# Patient Record
Sex: Female | Born: 2000 | Race: Black or African American | Hispanic: No | Marital: Single | Smoking: Never smoker
Health system: Southern US, Community
[De-identification: ages and names within clinical notes are randomized; demographics above are authoritative.]

## PROBLEM LIST (undated history)

## (undated) DIAGNOSIS — F419 Anxiety disorder, unspecified: Secondary | ICD-10-CM

## (undated) DIAGNOSIS — D649 Anemia, unspecified: Secondary | ICD-10-CM

## (undated) DIAGNOSIS — D571 Sickle-cell disease without crisis: Secondary | ICD-10-CM

## (undated) DIAGNOSIS — J45909 Unspecified asthma, uncomplicated: Secondary | ICD-10-CM

## (undated) DIAGNOSIS — T7840XA Allergy, unspecified, initial encounter: Secondary | ICD-10-CM

## (undated) HISTORY — DX: Allergy, unspecified, initial encounter: T78.40XA

## (undated) HISTORY — DX: Anxiety disorder, unspecified: F41.9

## (undated) HISTORY — DX: Sickle-cell disease without crisis: D57.1

## (undated) HISTORY — DX: Anemia, unspecified: D64.9

---
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
## 2018-03-18 IMAGING — DX DG CHEST 2V
2 series · 2 of 2 positions shown · non-contrast
Comparison: None.

CLINICAL DATA: Chest pain off and on for 4 weeks

EXAM:
CHEST  2 VIEW

[w chest pa]
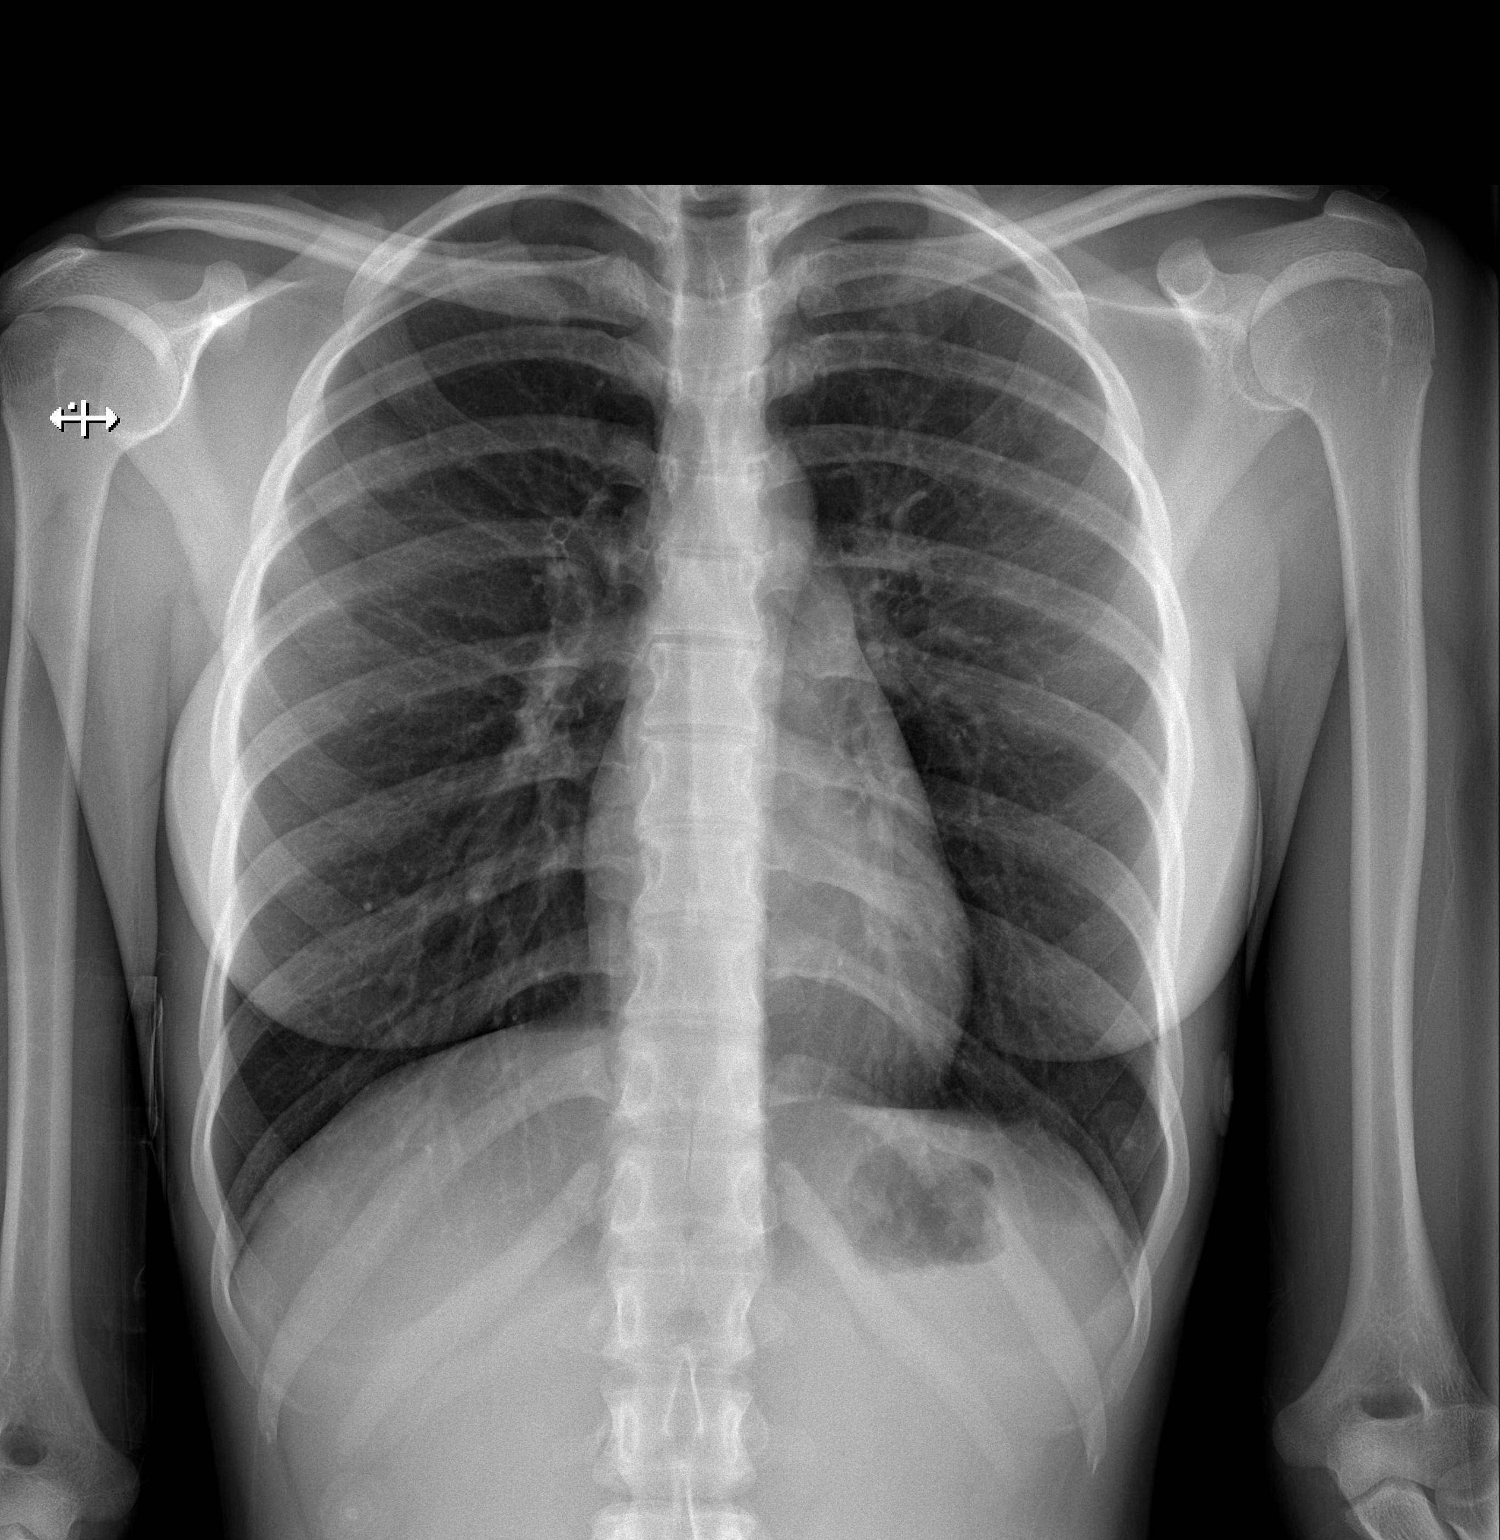

[w chest lat]
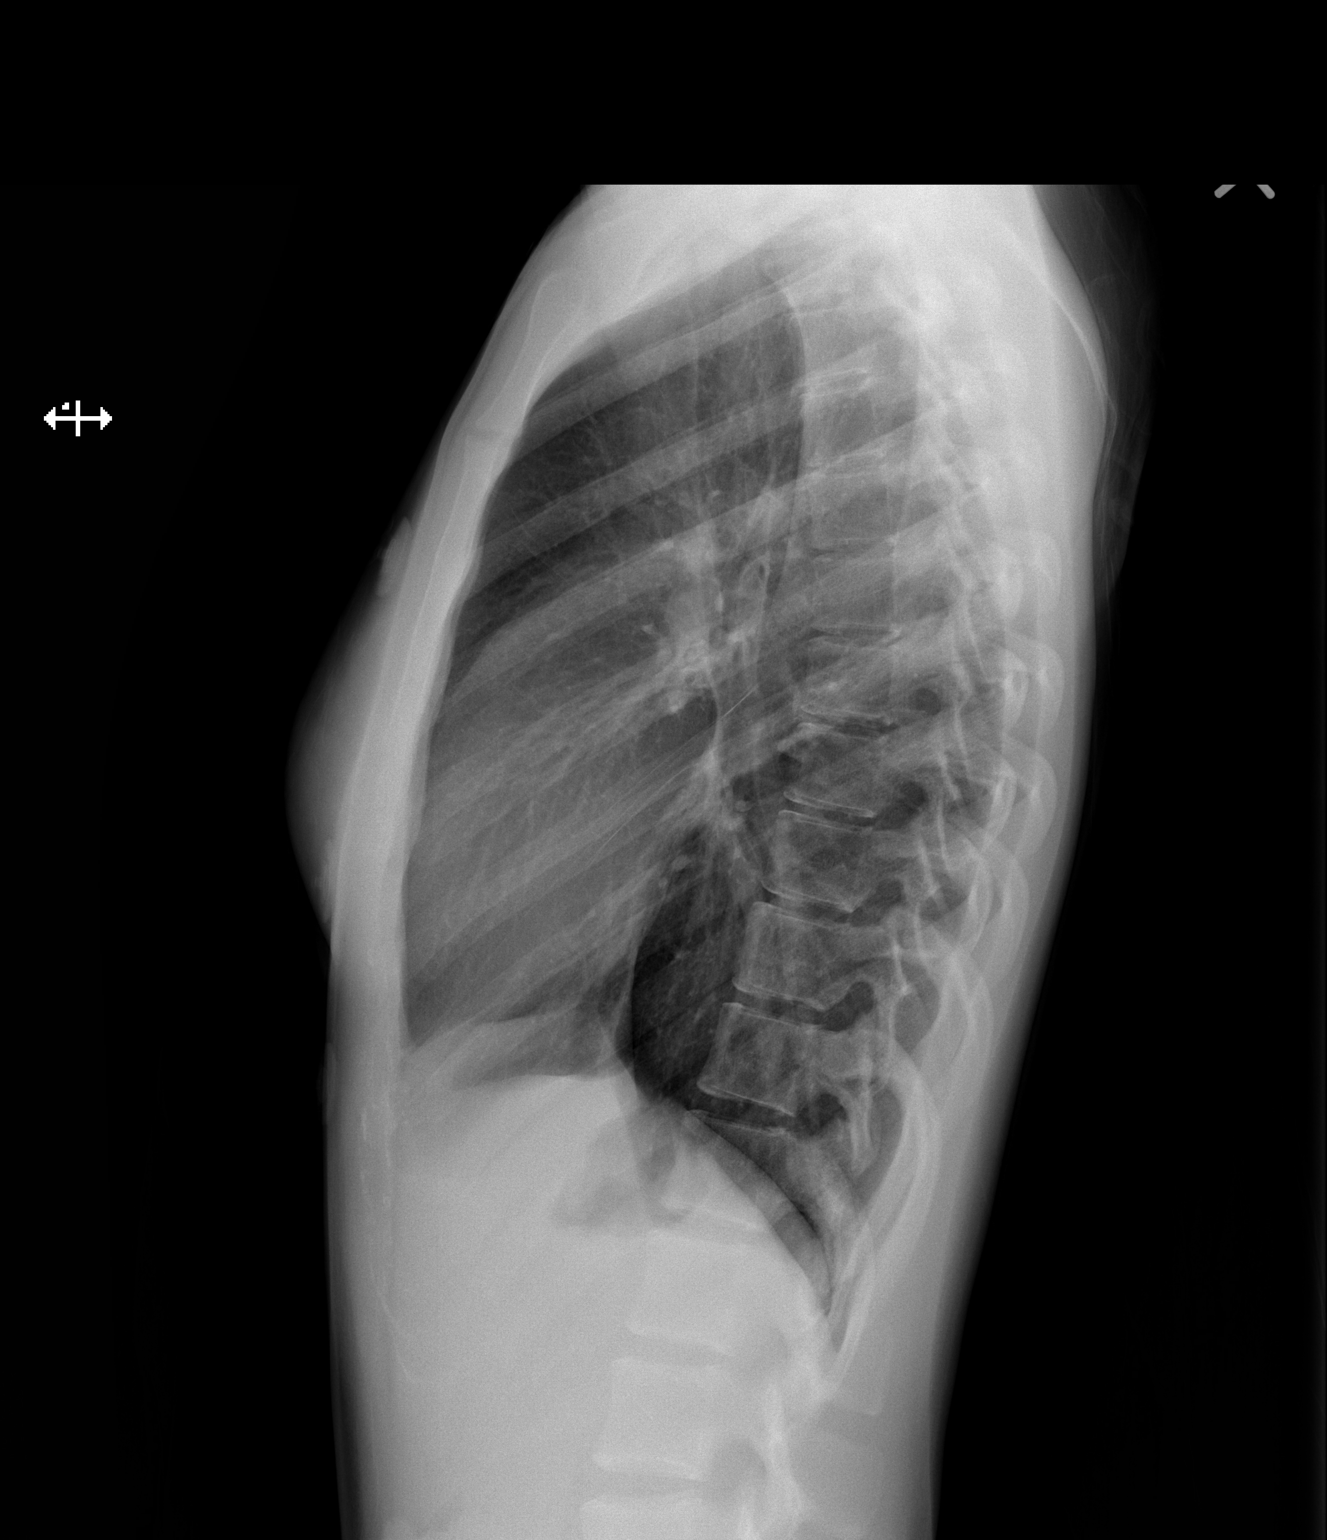

[2 of 2 positions shown; findings below may reference images not displayed]

FINDINGS: The heart size and mediastinal contours are within normal limits.
Both lungs are clear. The visualized skeletal structures are
unremarkable.
IMPRESSION: No active cardiopulmonary disease.

## 2018-06-26 ENCOUNTER — Ambulatory Visit (INDEPENDENT_AMBULATORY_CARE_PROVIDER_SITE_OTHER): Payer: Medicaid Other | Admitting: Pediatrics

## 2018-06-26 ENCOUNTER — Encounter: Payer: Self-pay | Admitting: Pediatrics

## 2018-06-26 VITALS — BP 120/85 | HR 63 | Ht 63.75 in | Wt 98.4 lb

## 2018-06-26 DIAGNOSIS — N920 Excessive and frequent menstruation with regular cycle: Secondary | ICD-10-CM | POA: Diagnosis not present

## 2018-06-26 DIAGNOSIS — Z130 Encounter for screening for diseases of the blood and blood-forming organs and certain disorders involving the immune mechanism: Secondary | ICD-10-CM | POA: Diagnosis not present

## 2018-06-26 DIAGNOSIS — D500 Iron deficiency anemia secondary to blood loss (chronic): Secondary | ICD-10-CM | POA: Diagnosis not present

## 2018-06-26 DIAGNOSIS — Z975 Presence of (intrauterine) contraceptive device: Secondary | ICD-10-CM

## 2018-06-26 DIAGNOSIS — Z3046 Encounter for surveillance of implantable subdermal contraceptive: Secondary | ICD-10-CM

## 2018-06-26 DIAGNOSIS — Z113 Encounter for screening for infections with a predominantly sexual mode of transmission: Secondary | ICD-10-CM

## 2018-06-26 LAB — POCT HEMOGLOBIN: Hemoglobin: 11.9 g/dL — AB (ref 12.2–16.2)

## 2018-06-26 MED ORDER — FERROUS SULFATE 325 (65 FE) MG PO TBEC: 325.0000 mg | DELAYED_RELEASE_TABLET | Freq: Every day | ORAL | 11 refills | Status: DC

## 2018-06-26 NOTE — Progress Notes (Signed)

## 2018-06-26 NOTE — Progress Notes (Signed)
History was provided by the patient.  Andrea GuanKeeshaun Fernandez is a 17 y.o. female who is here for follow up of nexplanon   PCP confirmed? Yes.  - was seeing resident PCP but needs to transition to adult care. Gave phone number and instructions for Family Medicine  [name], Cherece Nicole, MD  HPI:  She has been having a non-stop period for 1 year. It is heavy. She is bleeding every day. She goes through a lot of pads and tampons. She was fine for about a month or two but then has had straight bleeding.   Not currently sexually active. Is with a female parnter and prefers only females. She was attempting to use nexplanon to try and stop bleeding, however, it has obviously not been successful. She is unsure if she would remember a pill every day but does have some interest in the patch. She would like to see what her cycle does without anything.   Was having heavy periods. Used to have depo but that also didn't help bleeding as much as she would have liked.   Review of Systems  Constitutional: Negative for malaise/fatigue.  Eyes: Negative for double vision.  Respiratory: Negative for shortness of breath.   Cardiovascular: Negative for chest pain and palpitations.  Gastrointestinal: Negative for abdominal pain, constipation, diarrhea, nausea and vomiting.  Genitourinary: Negative for dysuria.  Musculoskeletal: Negative for joint pain and myalgias.  Skin: Negative for rash.  Neurological: Positive for headaches. Negative for dizziness.  Endo/Heme/Allergies: Does not bruise/bleed easily.     Patient Active Problem List   Diagnosis Date Noted  . Nexplanon in place 02/28/2017    Current Outpatient Medications on File Prior to Visit  Medication Sig Dispense Refill  . ibuprofen (ADVIL,MOTRIN) 200 MG tablet Take 800 mg by mouth every 6 (six) hours as needed for mild pain.     No current facility-administered medications on file prior to visit.     No Known Allergies  Physical Exam:    Vitals:    06/26/18 1714  BP: 120/85  Pulse: 63  Weight: 98 lb 6.4 oz (44.6 kg)  Height: 5' 3.75" (1.619 m)    Blood pressure percentiles are not available for patients who are 18 years or older. No LMP recorded.  Physical Exam  Constitutional: She is oriented to person, place, and time. She appears well-developed and well-nourished.  Thin  HENT:  Head: Normocephalic.  Neck: No thyromegaly present.  Cardiovascular: Normal rate, regular rhythm, normal heart sounds and intact distal pulses.  Pulmonary/Chest: Effort normal and breath sounds normal.  Abdominal: Soft. Bowel sounds are normal. There is no tenderness.  Musculoskeletal: Normal range of motion.  Neurological: She is alert and oriented to person, place, and time.  Skin: Skin is warm and dry.  Psychiatric: She has a normal mood and affect.     Assessment/Plan: 1. Encounter for Nexplanon removal See removal note. Tolerated well.   2. Nexplanon in place Has been unhappy with product. Will try with no product for now and see how her periods regulate. Has a history of menorrhagia and subsequent anemia.   3. Iron deficiency anemia due to chronic blood loss Hgb 11.9 today. Rx for ferrous sulfate 325 daily with breakfast. Will recheck in 3 monts.   4. Routine screening for STI (sexually transmitted infection) Per protocol.  - C. trachomatis/N. gonorrhoeae RNA - WET PREP BY MOLECULAR PROBE  5. Screening for iron deficiency anemia As above.  - POCT hemoglobin

## 2018-06-26 NOTE — Patient Instructions (Addendum)
You should establish with a new primary care provider because you are aging out of the clinic for primary care. We can still see you in adolescent medicine until you are 23 for any concerns with your nexplanon or mental health.   If you don't stop bleeding in 1 week, please let me know.   [name]  [phone] Please call and make a new patient appointment. It may be that there is some time before an appointment is available, but you should take the next available just to get established!    Your Nexplanon was removed today and is no longer preventing pregnancy.  If you have sex, remember to use condoms to prevent pregnancy and to prevent sexually transmitted infections.  Leave the outside bandage on for 24 hours.  Leave the smaller bandages on for 3-5 days or until they fall off on their own.  Keep the area clean and dry for 3-5 days.  There is usually bruising or swelling at and around the removal site for a few days to a week after the removal.  If you see redness or pus draining from the removal site, call us immediately.  We would like you to return to the clinic for a follow-up visit in 1 month.  You can call [facility] 24 hours a day with any questions or concerns.  There is always a nurse or doctor available to take your call.  Call 9-1-1 if you have a life-threatening emergency.  For anything else, please call us at [phone] before heading to the ER.   Iron Deficiency Anemia, Adult Iron deficiency anemia is a condition in which the concentration of red blood cells or hemoglobin in the blood is below normal because of too little iron. Hemoglobin is a substance in red blood cells that carries oxygen to the body's tissues. When the concentration of red blood cells or hemoglobin is too low, not enough oxygen reaches these tissues. Iron deficiency anemia is usually long-lasting (chronic) and it develops over time. It may or may not cause  symptoms. It is a common type of anemia. What are the causes? This condition may be caused by:  Not enough iron in the diet.  Blood loss caused by bleeding in the intestine.  Blood loss from a gastrointestinal condition like Crohn disease.  Frequent blood draws, such as from blood donation.  Abnormal absorption in the gut.  Heavy menstrual periods in women.  Cancers of the gastrointestinal system, such as colon cancer.  What are the signs or symptoms? Symptoms of this condition may include:  Fatigue.  Headache.  Pale skin, lips, and nail beds.  Poor appetite.  Weakness.  Shortness of breath.  Dizziness.  Cold hands and feet.  Fast or irregular heartbeat.  Irritability. This is more common in severe anemia.  Rapid breathing. This is more common in severe anemia.  Mild anemia may not cause any symptoms. How is this diagnosed? This condition is diagnosed based on:  Your medical history.  A physical exam.  Blood tests.  You may have additional tests to find the underlying cause of your anemia, such as:  Testing for blood in the stool (fecal occult blood test).  A procedure to see inside your colon and rectum (colonoscopy).  A procedure to see inside your esophagus and stomach (endoscopy).  A test in which cells are removed from bone marrow (bone marrow aspiration) or fluid is removed from the bone marrow to be examined (  biopsy). This is rarely needed.  How is this treated? This condition is treated by correcting the cause of your iron deficiency. Treatment may involve:  Adding iron-rich foods to your diet.  Taking iron supplements. If you are pregnant or breastfeeding, you may need to take extra iron because your normal diet usually does not provide the amount of iron that you need.  Increasing vitamin C intake. Vitamin C helps your body absorb iron. Your health care provider may recommend that you take iron supplements along with a glass of orange  juice or a vitamin C supplement.  Medicines to make heavy menstrual flow lighter.  Surgery.  You may need repeat blood tests to determine whether treatment is working. Depending on the underlying cause, the anemia should be corrected within 2 months of starting treatment. If the treatment does not seem to be working, you may need more testing. Follow these instructions at home: Medicines  Take over-the-counter and prescription medicines only as told by your health care provider. This includes iron supplements and vitamins.  If you cannot tolerate taking iron supplements by mouth, talk with your health care provider about taking them through a vein (intravenously) or an injection into a muscle.  For the best iron absorption, you should take iron supplements when your stomach is empty. If you cannot tolerate them on an empty stomach, you may need to take them with food.  Do not drink milk or take antacids at the same time as your iron supplements. Milk and antacids may interfere with iron absorption.  Iron supplements can cause constipation. To prevent constipation, include fiber in your diet as told by your health care provider. A stool softener may also be recommended. Eating and drinking  Talk with your health care provider before changing your diet. He or she may recommend that you eat foods that contain a lot of iron, such as: ? Liver. ? Low-fat (lean) beef. ? Breads and cereals that have iron added to them (are fortified). ? Eggs. ? Dried fruit. ? Dark green, leafy vegetables.  To help your body use the iron from iron-rich foods, eat those foods at the same time as fresh fruits and vegetables that are high in vitamin C. Foods that are high in vitamin C include: ? Oranges. ? Peppers. ? Tomatoes. ? Mangoes.  Drinkenoughfluid to keep your urine clear or pale yellow. General instructions  Return to your normal activities as told by your health care provider. Ask your health  care provider what activities are safe for you.  Practice good hygiene. Anemia can make you more prone to illness and infection.  Keep all follow-up visits as told by your health care provider. This is important. Contact a health care provider if:  You feel nauseous or you vomit.  You feel weak.  You have unexplained sweating.  You develop symptoms of constipation, such as: ? Having fewer than three bowel movements a week. ? Straining to have a bowel movement. ? Having stools that are hard, dry, or larger than normal. ? Feeling full or bloated. ? Pain in the lower abdomen. ? Not feeling relief after having a bowel movement. Get help right away if:  You faint. If this happens, do not drive yourself to the hospital. Call your local emergency services (911 in the [place]  You have chest pain.  You have shortness of breath that: ? Is severe. ? Gets worse with physical activity.  You have a rapid heartbeat.  You become light-headed when getting  up from a sitting or lying down position. This information is not intended to replace advice given to you by your health care provider. Make sure you discuss any questions you have with your health care provider. Document Released: 10/27/2000 Document Revised: 07/19/2016 Document Reviewed: 07/19/2016 Elsevier Interactive Patient Education  2018 [employer]

## 2018-06-27 LAB — WET PREP BY MOLECULAR PROBE
CANDIDA SPECIES: DETECTED — AB
MICRO NUMBER: 90965795
SPECIMEN QUALITY:: ADEQUATE
Trichomonas vaginosis: NOT DETECTED

## 2018-06-27 LAB — C. TRACHOMATIS/N. GONORRHOEAE RNA
C. trachomatis RNA, TMA: NOT DETECTED
N. GONORRHOEAE RNA, TMA: NOT DETECTED

## 2018-07-05 ENCOUNTER — Other Ambulatory Visit: Payer: Self-pay | Admitting: Family

## 2018-07-05 MED ORDER — FLUCONAZOLE 150 MG PO TABS: ORAL_TABLET | ORAL | 0 refills | Status: DC

## 2018-07-05 MED ORDER — METRONIDAZOLE 500 MG PO TABS: 500.0000 mg | ORAL_TABLET | Freq: Two times a day (BID) | ORAL | 0 refills | Status: AC

## 2018-08-11 ENCOUNTER — Emergency Department (HOSPITAL_COMMUNITY)
Admission: EM | Admit: 2018-08-11 | Discharge: 2018-08-11 | Discharge status: Against Medical Advice | Payer: Medicaid Other

## 2018-08-11 NOTE — ED Notes (Signed)
No answer for triage.

## 2018-08-11 NOTE — ED Notes (Signed)
Called for triage x3, discharging LWBS

## 2018-08-11 NOTE — ED Notes (Signed)
Called for triage x2

## 2018-09-26 ENCOUNTER — Ambulatory Visit: Payer: Self-pay | Admitting: Pediatrics

## 2018-10-02 ENCOUNTER — Ambulatory Visit: Payer: Medicaid Other | Admitting: Pediatrics

## 2018-12-06 ENCOUNTER — Encounter (HOSPITAL_COMMUNITY): Payer: Self-pay

## 2018-12-06 ENCOUNTER — Other Ambulatory Visit: Payer: Self-pay

## 2018-12-06 ENCOUNTER — Emergency Department (HOSPITAL_COMMUNITY)
Admission: EM | Admit: 2018-12-06 | Discharge: 2018-12-06 | Disposition: A | Discharge status: Home / Self Care | Payer: Medicaid Other | Attending: Emergency Medicine | Admitting: Emergency Medicine

## 2018-12-06 DIAGNOSIS — R509 Fever, unspecified: Secondary | ICD-10-CM | POA: Diagnosis present

## 2018-12-06 DIAGNOSIS — M7918 Myalgia, other site: Secondary | ICD-10-CM | POA: Insufficient documentation

## 2018-12-06 DIAGNOSIS — Z87891 Personal history of nicotine dependence: Secondary | ICD-10-CM | POA: Insufficient documentation

## 2018-12-06 DIAGNOSIS — Z79899 Other long term (current) drug therapy: Secondary | ICD-10-CM | POA: Diagnosis not present

## 2018-12-06 DIAGNOSIS — R05 Cough: Secondary | ICD-10-CM | POA: Diagnosis not present

## 2018-12-06 DIAGNOSIS — J111 Influenza due to unidentified influenza virus with other respiratory manifestations: Secondary | ICD-10-CM | POA: Insufficient documentation

## 2018-12-06 DIAGNOSIS — R070 Pain in throat: Secondary | ICD-10-CM | POA: Diagnosis not present

## 2018-12-06 DIAGNOSIS — R69 Illness, unspecified: Secondary | ICD-10-CM

## 2018-12-06 MED ORDER — IBUPROFEN 600 MG PO TABS: 600.0000 mg | ORAL_TABLET | Freq: Four times a day (QID) | ORAL | 0 refills | Status: DC | PRN

## 2018-12-06 MED ORDER — IBUPROFEN 800 MG PO TABS
800.0000 mg | ORAL_TABLET | Freq: Once | ORAL | Status: AC
  Administered 2018-12-06: 800 mg via ORAL
  Filled 2018-12-06: qty 1

## 2018-12-06 MED ORDER — ONDANSETRON 4 MG PO TBDP: 4.0000 mg | ORAL_TABLET | Freq: Three times a day (TID) | ORAL | 0 refills | Status: DC | PRN

## 2018-12-06 MED ORDER — BENZONATATE 100 MG PO CAPS: 100.0000 mg | ORAL_CAPSULE | Freq: Three times a day (TID) | ORAL | 0 refills | Status: DC | PRN

## 2018-12-06 MED ORDER — ONDANSETRON 4 MG PO TBDP
8.0000 mg | ORAL_TABLET | Freq: Once | ORAL | Status: AC
  Administered 2018-12-06: 8 mg via ORAL
  Filled 2018-12-06: qty 2

## 2018-12-06 NOTE — ED Triage Notes (Signed)
Pt here with fever, fatigue and cough for last day. Ptt sates attempt to take cough medicine but threw it back up.  A&ox4.

## 2018-12-06 NOTE — Discharge Instructions (Signed)
Get plenty of rest and drink plenty of fluids.  Continue ibuprofen every 6 hours as prescribed for management of fever and body aches.  You may use Tessalon for cough as prescribed and Zofran for nausea.  Continue to use other over-the-counter remedies as needed for additional symptom control.  Follow-up with your primary doctor to ensure resolution of symptoms.

## 2018-12-06 NOTE — ED Provider Notes (Signed)
[facility] Provider Note   CSN: [id number] Arrival date & time: 12/06/18  2125     History   Chief Complaint Chief Complaint  Patient presents with  . Fever  . Fatigue    HPI Andrea Fernandez is a 18 y.o. female.   18 year old female presents to the emergency department for evaluation of flulike illness.  She states that she began with a sore throat yesterday and awoke this morning with a fever.  Maximum temperature 102.37F prior to arrival.  She has been experiencing fatigue as well as generalized body aches and a cough.  She has been able to tolerate some fluids, but did have one episode of emesis after using a generic brand of Benadryl cough and cold medicine.  She has not used any additional antipyretics.  Denies pleuritic chest pain, difficulty swallowing, shortness of breath, abdominal pain, sick contacts.  She did not receive her flu shot this year.  The history is provided by the patient. No language interpreter was used.  Fever    History reviewed. No pertinent past medical history.  Patient Active Problem List   Diagnosis Date Noted  . Iron deficiency anemia due to chronic blood loss 06/26/2018  . Menorrhagia with regular cycle 06/26/2018  . Nexplanon in place 02/28/2017    History reviewed. No pertinent surgical history.   OB History   No obstetric history on file.      Home Medications    Prior to Admission medications   Medication Sig Start Date End Date Taking? Authorizing Provider  benzonatate (TESSALON) 100 MG capsule Take 1 capsule (100 mg total) by mouth 3 (three) times daily as needed for cough. 12/06/18   Antony MaduraHumes, Khi Mcmillen, PA-C  ferrous sulfate 325 (65 FE) MG EC tablet Take 1 tablet (325 mg total) by mouth daily with breakfast. 06/26/18 06/26/19  Verneda SkillHacker, Caroline T, FNP  fluconazole (DIFLUCAN) 150 MG tablet Take 1 tablet today and 1 tablet 3 days from now 07/05/18   Georges MouseJones, Christy M, NP  ibuprofen (ADVIL,MOTRIN) 600  MG tablet Take 1 tablet (600 mg total) by mouth every 6 (six) hours as needed for fever, headache, mild pain or moderate pain. 12/06/18   Antony MaduraHumes, Flemon Kelty, PA-C  ondansetron (ZOFRAN ODT) 4 MG disintegrating tablet Take 1 tablet (4 mg total) by mouth every 8 (eight) hours as needed for nausea or vomiting. 12/06/18   Antony MaduraHumes, Kortland Nichols, PA-C    Family History History reviewed. No pertinent family history.  Social History Social History   Tobacco Use  . Smoking status: Former Games developermoker  . Smokeless tobacco: Never Used  Substance Use Topics  . Alcohol use: Yes  . Drug use: No     Allergies   Patient has no known allergies.   Review of Systems Review of Systems  Constitutional: Positive for fever.  Ten systems reviewed and are negative for acute change, except as noted in the HPI.    Physical Exam Updated Vital Signs BP (!) 101/57 (BP Location: Left Arm)   Pulse 98   Temp 98.6 F (37 C) (Oral)   Resp 16   SpO2 95%   Physical Exam Vitals signs and nursing note reviewed.  Constitutional:      General: She is not in acute distress.    Appearance: She is well-developed. She is not diaphoretic.     Comments: Patient nontoxic-appearing and in no distress  HENT:     Head: Normocephalic and atraumatic.     Right Ear: Tympanic membrane,  ear canal and external ear normal.     Left Ear: Tympanic membrane, ear canal and external ear normal.     Mouth/Throat:     Mouth: Mucous membranes are moist.     Comments: Uvula midline.  No posterior oropharyngeal edema or significant erythema.  The patient tolerates secretions without difficulty.  No tripoding, drooling.  Normal phonation. Eyes:     General: No scleral icterus.    Conjunctiva/sclera: Conjunctivae normal.  Neck:     Musculoskeletal: Normal range of motion.     Comments: No meningismus Cardiovascular:     Rate and Rhythm: Regular rhythm. Tachycardia present.     Pulses: Normal pulses.     Comments: Mild tachycardia, likely secondary  to fever Pulmonary:     Effort: Pulmonary effort is normal. No respiratory distress.     Breath sounds: No wheezing, rhonchi or rales.     Comments: No wheezing, rales, rhonchi.  Respirations even and unlabored. Abdominal:     General: Abdomen is flat.     Palpations: There is no mass.     Tenderness: There is no abdominal tenderness. There is no guarding.     Comments: Soft, nontender, nondistended  Musculoskeletal: Normal range of motion.  Skin:    General: Skin is warm and dry.     Coloration: Skin is not pale.     Findings: No erythema or rash.  Neurological:     Mental Status: She is alert and oriented to person, place, and time.     Coordination: Coordination normal.  Psychiatric:        Behavior: Behavior normal.      ED Treatments / Results  Labs (all labs ordered are listed, but only abnormal results are displayed) Labs Reviewed - No data to display  EKG None  Radiology No results found.  Procedures Procedures (including critical care time)  Medications Ordered in ED Medications  ibuprofen (ADVIL,MOTRIN) tablet 800 mg (800 mg Oral Given 12/06/18 2227)  ondansetron (ZOFRAN-ODT) disintegrating tablet 8 mg (8 mg Oral Given 12/06/18 2227)     Initial Impression / Assessment and Plan / ED Course  I have reviewed the triage vital signs and the nursing notes.  Pertinent labs & imaging results that were available during my care of the patient were reviewed by me and considered in my medical decision making (see chart for details).     Patient with symptoms consistent with influenza.  Vitals are stable, low-grade fever.  Tolerating PO water in the ED after Zofran.  Lungs are clear.  No tachypnea, dyspnea, hypoxia to suggest pneumonia.  Discussed the cost versus benefit of Tamiflu treatment with the patient.  Patient opts to continue with supportive care at this time; forego Tamiflu prescription.  Patient discharged with instructions to orally hydrate, rest, and use  over-the-counter medications such as NSAIDs for muscle aches and Tylenol for fever.  Further given prescription for Tessalon and Zofran.  Encouraged primary care follow-up.  Return precautions discussed and provided. Patient discharged in stable condition with no unaddressed concerns.   Final Clinical Impressions(s) / ED Diagnoses   Final diagnoses:  Influenza-like illness    ED Discharge Orders         Ordered    ibuprofen (ADVIL,MOTRIN) 600 MG tablet  Every 6 hours PRN     12/06/18 2242    benzonatate (TESSALON) 100 MG capsule  3 times daily PRN     12/06/18 2242    ondansetron (ZOFRAN ODT) 4 MG disintegrating tablet  Every 8 hours PRN     12/06/18 2242           Antony MaduraHumes, Mikela Senn, Cordelia Poche-C 12/06/18 2341    Vanetta MuldersZackowski, Scott, MD 12/08/18 [phone]

## 2018-12-06 NOTE — ED Notes (Signed)
Patient verbalizes understanding of discharge instructions. Opportunity for questioning and answers were provided. Armband removed by staff, pt discharged from ED.  

## 2018-12-10 ENCOUNTER — Other Ambulatory Visit: Payer: Self-pay | Admitting: Pediatrics

## 2018-12-11 ENCOUNTER — Ambulatory Visit (INDEPENDENT_AMBULATORY_CARE_PROVIDER_SITE_OTHER): Payer: Medicaid Other | Admitting: Student in an Organized Health Care Education/Training Program

## 2018-12-11 ENCOUNTER — Other Ambulatory Visit: Payer: Self-pay

## 2018-12-11 ENCOUNTER — Encounter: Payer: Self-pay | Admitting: Student in an Organized Health Care Education/Training Program

## 2018-12-11 VITALS — BP 108/80 | Temp 98.9°F | Wt 99.2 lb

## 2018-12-11 DIAGNOSIS — Z23 Encounter for immunization: Secondary | ICD-10-CM | POA: Diagnosis not present

## 2018-12-11 DIAGNOSIS — R05 Cough: Secondary | ICD-10-CM | POA: Diagnosis not present

## 2018-12-11 DIAGNOSIS — N898 Other specified noninflammatory disorders of vagina: Secondary | ICD-10-CM | POA: Diagnosis not present

## 2018-12-11 DIAGNOSIS — R059 Cough, unspecified: Secondary | ICD-10-CM

## 2018-12-11 NOTE — Progress Notes (Signed)
   Subjective:     Andrea Fernandez, is a 18 y.o. female     History provider by patient No interpreter necessary.  Chief Complaint  Patient presents with  . Follow-up    er, still has a bad cough and is worse at night    HPI:   Presented to ED on 12/06/18 with flu like ilness. Provided supportive are measures, decline Tamiflu.   Cough still present. Worse at night. Rhinorrhea. Taking cough medicine (cold and flu) hasnt been helping, took some last night. No fever. No sore throat. Not as interested in eating, but drinking okay. UOP x2 yesterday.   Other concern: White discharge, strong smell. Has been present for the past 1-2 weeks. Has not recently been on antibiotics. No itching. Drainage throughout the day. Last period was the 11/28/18. Normal period for her. Yes sexual activity. Female partner only and one partner. No pain with intercourse. Abdominal pain with coughing. Taking Ibuprofen. No history of similar symptoms. No history of STD. No dysuria, urgency, frequency. No fevers. No recent bubble baths.   Personal number: [phone]   Patient's history was reviewed and updated as appropriate: allergies, past family history, past medical history, past social history and past surgical history.     Objective:     BP 108/80 (BP Location: Left Arm, Patient Position: Sitting, Cuff Size: Normal)   Temp 98.9 F (37.2 C) (Temporal)   Wt 99 lb 4 oz (45 kg)   BMI 17.17 kg/m   Physical Exam General: Alert, well-appearing female in NAD.  HEENT:   Eyes: Sclerae are anicteric.   Ears: TMs clear bilaterally with normal light reflex and landmarks visualized, no erythema  Nose: no nasal drainage  Throat:  Moist mucous membranes.Oropharynx clear with no erythema or exudate Neck: normal range of motion, no lymphadenopathy Cardiovascular: Regular rate and rhythm, S1 and S2 normal. No murmur, rub, or gallop appreciated.  Pulmonary: Normal work of breathing. Clear to auscultation  bilaterally with no wheezes or crackles present     Assessment & Plan:   1. Cough Discussed that cough can be prolonged after viral illness. Discussed supportive measures. No history of asthma. Cough worse at night most likely is post nasal drip in setting of rhinorrhea.   2. Vaginal discharge - C. trachomatis/N. gonorrhoeae RNA - WET PREP BY MOLECULAR PROBE  3. Need for vaccination - Flu Vaccine QUAD 36+ mos IM   Return if symptoms worsen or fail to improve.  Janalyn Harder, MD

## 2018-12-12 ENCOUNTER — Other Ambulatory Visit: Payer: Self-pay | Admitting: Student in an Organized Health Care Education/Training Program

## 2018-12-12 DIAGNOSIS — B9689 Other specified bacterial agents as the cause of diseases classified elsewhere: Secondary | ICD-10-CM

## 2018-12-12 DIAGNOSIS — N760 Acute vaginitis: Principal | ICD-10-CM

## 2018-12-12 LAB — WET PREP BY MOLECULAR PROBE
Candida species: NOT DETECTED
MICRO NUMBER: 121608
SPECIMEN QUALITY: ADEQUATE
TRICHOMONAS VAG: NOT DETECTED

## 2018-12-12 LAB — C. TRACHOMATIS/N. GONORRHOEAE RNA
C. TRACHOMATIS RNA, TMA: NOT DETECTED
N. GONORRHOEAE RNA, TMA: NOT DETECTED

## 2018-12-12 MED ORDER — METRONIDAZOLE 250 MG PO TABS: 500.0000 mg | ORAL_TABLET | Freq: Two times a day (BID) | ORAL | Status: DC

## 2018-12-12 NOTE — Progress Notes (Signed)
I was the supervising attending physician for this encounter.  I was immediately available via phone.  Kate Ettefagh, MD  

## 2018-12-13 ENCOUNTER — Other Ambulatory Visit: Payer: Self-pay | Admitting: Pediatrics

## 2018-12-13 DIAGNOSIS — B9689 Other specified bacterial agents as the cause of diseases classified elsewhere: Secondary | ICD-10-CM

## 2018-12-13 DIAGNOSIS — N760 Acute vaginitis: Principal | ICD-10-CM

## 2018-12-13 MED ORDER — METRONIDAZOLE 500 MG PO TABS: 500.0000 mg | ORAL_TABLET | Freq: Two times a day (BID) | ORAL | 0 refills | Status: AC

## 2019-02-02 DIAGNOSIS — M940 Chondrocostal junction syndrome [Tietze]: Secondary | ICD-10-CM | POA: Diagnosis not present

## 2019-03-19 ENCOUNTER — Other Ambulatory Visit: Payer: Self-pay

## 2019-03-19 ENCOUNTER — Encounter: Payer: Self-pay | Admitting: Pediatrics

## 2019-03-19 ENCOUNTER — Ambulatory Visit (INDEPENDENT_AMBULATORY_CARE_PROVIDER_SITE_OTHER): Payer: Medicaid Other | Admitting: Pediatrics

## 2019-03-19 DIAGNOSIS — R630 Anorexia: Secondary | ICD-10-CM | POA: Diagnosis not present

## 2019-03-19 DIAGNOSIS — F331 Major depressive disorder, recurrent, moderate: Secondary | ICD-10-CM

## 2019-03-19 DIAGNOSIS — F411 Generalized anxiety disorder: Secondary | ICD-10-CM

## 2019-03-19 DIAGNOSIS — G479 Sleep disorder, unspecified: Secondary | ICD-10-CM | POA: Insufficient documentation

## 2019-03-19 HISTORY — DX: Sleep disorder, unspecified: G47.9

## 2019-03-19 HISTORY — DX: Anorexia: R63.0

## 2019-03-19 HISTORY — DX: Major depressive disorder, recurrent, moderate: F33.1

## 2019-03-19 HISTORY — DX: Generalized anxiety disorder: F41.1

## 2019-03-19 MED ORDER — SERTRALINE HCL 50 MG PO TABS: ORAL_TABLET | ORAL | 1 refills | Status: DC

## 2019-03-19 NOTE — Progress Notes (Signed)
Virtual Visit via Video Note  I connected with Andrea Fernandez 's patient  on 03/19/19 at  3:00 PM EDT by a video enabled telemedicine application and verified that I am speaking with the correct person using two identifiers.   Location of patient/parent: At home   I discussed the limitations of evaluation and management by telemedicine and the availability of in person appointments.  I discussed that the purpose of this phone visit is to provide medical care while limiting exposure to the novel coronavirus.  The patient expressed understanding and agreed to proceed.  Reason for visit: Depression symptoms   History of Present Illness:  Having difficulty eating, sleeping, not wanting to do things. Difficult to fall asleep and sometime stay asleep.  Working at [employer].  Endorses that she is stressed, but hard to say what is causing it.  Crying sometimes and doesn't know why.  Started about last month.  Has never had symptoms like this before.  Depression about 7/10.  Has some thoughts of being better off not here, particularly when she is by herself. Starts thinking, mind wonders. Has thought about slitting her wrists but has not thought of ways to end her life.  Tries to keep self active with work and playing with her dog.  Support person is mom kind of- she knows she is depressed but she doesn't really know the full extent.  Mom and sister have depression- fairly significant. Sister was on depression medication but not sure which one-thinks that it is zoloft.  After education and discussion, she is open to counseling and medication therapy at this point.  She does not have a scale in the house to weigh herself.   PHQSADs reviewed and indicated moderate anxiety and severe depressive symptoms.  [id number]: 19 GAD7: 16 PHQ9: 24 Extremely difficult   Reports she is safe to self today.    Observations/Objective:   Physical Exam Constitutional:      Appearance: Normal appearance.  HENT:     Head: Normocephalic.  Musculoskeletal: Normal range of motion.  Neurological:     General: No focal deficit present.     Mental Status: She is alert and oriented to person, place, and time.  Psychiatric:        Mood and Affect: Mood normal.        Behavior: Behavior normal.     Assessment and Plan:  1. MDD (major depressive disorder), recurrent episode, moderate (HCC) Will start zoloft and [facility] Time will tell how long she needs medication and therapy ongoing. I discussed that usually we use medications for at least 3-6 months to improve symptoms and then discuss how things are. She was in agreement. Discuss r/b/s/e including BBW. She expressed understanding. Provided crisis resources for acute suicidality.  - sertraline (ZOLOFT) 50 MG tablet; Take 1/2 tablet for 3 days. If well tolerated, increase to 1 tablet daily by mouth  Dispense: 30 tablet; Refill: 1  2. Sleep disturbance Likely r/t depression and anxiety sx- will monitor with initiation of zoloft.   3. GAD (generalized anxiety disorder) As above.   4. Anorexia Related to depression. She does not have a scale but will monitor improvement of intake with zoloft. Hopefully it will help her appetite.    Follow Up Inst[facility] 2 weeks with me for med f/u    I discussed the assessment and treatment plan with the patient and/or parent/guardian. They were provided an opportunity to ask questions and all were  answered. They agreed with the plan and demonstrated an understanding of the instructions.   They were advised to call back or seek an in-person evaluation in the emergency room if the symptoms worsen or if the condition fails to improve as anticipated.  I provided 27 minutes of non-face-to-face time and 0 minutes of care coordination during this encounter I was located at off site during this encounter.  Alfonso Ramus, FNP

## 2019-03-20 ENCOUNTER — Ambulatory Visit (INDEPENDENT_AMBULATORY_CARE_PROVIDER_SITE_OTHER): Payer: Medicaid Other | Admitting: Licensed Clinical Social Worker

## 2019-03-20 DIAGNOSIS — F4321 Adjustment disorder with depressed mood: Secondary | ICD-10-CM | POA: Diagnosis not present

## 2019-03-20 NOTE — BH Specialist Note (Signed)
Integrated Behavioral Health via Telemedicine Video Visit  03/20/2019 Andrea Fernandez [id number]  Number of Integrated Behavioral Health visits: 1/6 Session Start time: 4:10 PM  Session End time: 5:10PM Total time: 1 hour  Referring Provider: Candida Peeling Type of Visit: Video Patient/Family location: Home [facility] Provider location: Remote All persons participating in visit: Patient   [address] [place] [address].  Best contac.    Confirmed patient's address: Yes  [address] [place] [address] Confirmed patient's phone number: Yes  Any changes to demographics: Yes - Patient no longer reside at mothers address in the system.   Confirmed patient's insurance: Yes  Any changes to patient's insurance: No   Discussed confidentiality: Yes   I connected with@ and/or Tyrone Strickler's patient by a video enabled telemedicine application and verified that I am speaking with the correct person using two identifiers.     I discussed the limitations of evaluation and management by telemedicine and the availability of in person appointments.  I discussed that the purpose of this visit is to provide behavioral health care while limiting exposure to the novel coronavirus.   Discussed there is a possibility of technology failure and discussed alternative modes of communication if that failure occurs.  I discussed that engaging in this video visit, they consent to the provision of behavioral healthcare and the services will be billed under their insurance.  Patient and/or legal guardian expressed understanding and consented to video visit: Yes   PRESENTING CONCERNS: Patient and/or family reports the following symptoms/concerns: Patient reports depressive symptoms including barely eating, trouble sleeping, consumed with thoughts and little pleasure in doing things for past two week. Patient report  Feeling stress prior to symptoms and feels it turned it to depressive symptoms.    Notable stressors include over-thinking, limited support w paying bill and tranportation to work  and relationship tension.    Goal: To gain a peace of mind, gain understanding and feel understood and receive feedback and strategies to decrease symptoms.    Duration of problem: Weeks; Severity of problem: mild      STRENGTHS (Protective Factors/Coping Skills): Willingness to try strategies  GOALS ADDRESSED: Patient will: 1.  Reduce symptoms of: depression  2.  Increase knowledge and/or ability of: coping skills  3.  Demonstrate ability to: Increase healthy adjustment to current life circumstances  INTERVENTIONS: Interventions utilized:  Behavioral Activation, Supportive Counseling and Psychoeducation and/or Health Education Standardized Assessments completed: Not Needed, PHQ-SAD indicated fro next visit.     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 Self-Care/Coping Skills: Life changes:  Pt hesitant to disclose, allude to relationship conflict   Social History:  Lifestyle habits that can impact QOL: Sleep:Hard to sleep last 2.5 weeks. Bedtime 12/1AM - 5AM( when have to work)  -  wake up twice in night (bathroom, hungry) get back to sleep fairly easy.  Eating habits/patterns: snacking - chips, fruit. Decrease appetite Water intake: 1-2 cups a day- Counsel Provided Screen time: All day - social media- ( facebook)  Exercise: No    Confidentiality was discussed with the patient and if applicable, with caregiver as well.  Gender identity: female  Sex assigned at birth: female  Pronouns: she Tobacco?  no Drugs/ETOH?  yes, marijuanna - 1 blunt daily, helps feel relaxed and calm( since 2 years ago) with friends. Pt drinks  alcohol about 2 days a week- sometimes  with friends or alone, drink of choice paulmason - dark liquor, had difficulty quantify amount she drinks - say it is  a lot ( SBIRT?)  Partner preference?  female  Sexually Active?  yes - female.  Pregnancy Prevention:  none Reviewed condoms:  ye Reviewed EC:  yes   History or current traumatic events (natural disaster, house fire, etc.)? no History or current physical trauma?  no History or current emotional trauma?  no History or current sexual trauma?  no History or current domestic or intimate partner violence?  no History of bullying:  no  Trusted adult at home/school:  yes, mom- most of time .  Feels safe at home:  yes Trusted friends:  yes, [place].  Feels safe at school:  N/A   Suicidal or homicidal thoughts?   No Self injurious behaviors?  Yes, a year ago but thinks about it about weekly. Last thought of cutting  A week ago but did not go through with it.   Thinking 'its not worth it' and about people that need her ( nieces, nephew and mom). Pt has not disclosed self harm thought or hx to anyone( family/friends)   Guns in the home?  no      ASSESSMENT: Patient currently experiencing recent onset of depressive symptoms triggered by psychosocial  stressors. Patient with unhealthy coping skills and difficulty expressing things of enjoyment ( currently/previosuly).   Patient may benefit from picking 1-2 activities to do a week from activity list, emailed.   Patient may benefit from doing first and feeling later, not waiting on when she feels like doing something to do the activity.   PLAN: 1. Follow up with behavioral health clinician on : F/U VIDEO visit joint 04/02/19 2. Behavioral recommendations: see above 3. Referral(s): [facility]  I discussed the assessment and treatment plan with the patient and/or parent/guardian. They were provided an opportunity to ask questions and all were answered. They agreed with the plan and demonstrated an understanding of the instructions.   They were advised to call back or seek an in-person evaluation if the  symptoms worsen or if the condition fails to improve as anticipated.  Malin Sambrano P Taylan Marez

## 2019-04-02 ENCOUNTER — Other Ambulatory Visit: Payer: Self-pay

## 2019-04-02 ENCOUNTER — Ambulatory Visit (INDEPENDENT_AMBULATORY_CARE_PROVIDER_SITE_OTHER): Payer: Medicaid Other | Admitting: Pediatrics

## 2019-04-02 ENCOUNTER — Ambulatory Visit: Payer: Medicaid Other | Admitting: Licensed Clinical Social Worker

## 2019-04-02 DIAGNOSIS — G479 Sleep disorder, unspecified: Secondary | ICD-10-CM

## 2019-04-02 DIAGNOSIS — R630 Anorexia: Secondary | ICD-10-CM

## 2019-04-02 DIAGNOSIS — F411 Generalized anxiety disorder: Secondary | ICD-10-CM | POA: Diagnosis not present

## 2019-04-02 DIAGNOSIS — F331 Major depressive disorder, recurrent, moderate: Secondary | ICD-10-CM

## 2019-04-02 NOTE — Progress Notes (Signed)
Virtual Visit via Video Note  I connected with Andrea Fernandez 's patient  on 04/02/19 at  2:30 PM EDT by a video enabled telemedicine application and verified that I am speaking with the correct person using two identifiers.   Location of patient/parent: At home   I discussed the limitations of evaluation and management by telemedicine and the availability of in person appointments.  I discussed that the purpose of this phone visit is to provide medical care while limiting exposure to the novel coronavirus.  The patient expressed understanding and agreed to proceed.  Reason for visit: med f/u  History of Present Illness: at the beginning she ewas having some s/e with dizziness, nausea. Sometimes she had issues with vision but she is doing much better now.   Mood has been good since she started it. No major ups and downs. Taking at night. Is sleeping much better. Hasn't missed any days. Appetite has been much better.   Still working. No concerns about cough/cold/fever.   No SI/HI. Met with shiniqua and will meet with her again today.    Observations/Objective:  Physical Exam Constitutional:      Appearance: Normal appearance.  Pulmonary:     Effort: Pulmonary effort is normal.  Musculoskeletal: Normal range of motion.  Neurological:     Mental Status: She is alert and oriented to person, place, and time.  Psychiatric:        Mood and Affect: Mood normal.        Behavior: Behavior normal.    Assessment and Plan:  1. GAD (generalized anxiety disorder) Much improved. Continue zoloft.   2. MDD (major depressive disorder), recurrent episode, moderate ([place]) As above. Continue with therapy as well.   3. Sleep disturbance Much improved with medication.   4. Anorexia Much improved with zoloft.    Follow Up Instructions: 4 weeks or sooner via mychrat as needed    I discussed the assessment and treatment plan with the patient and/or parent/guardian. They were provided an  opportunity to ask questions and all were answered. They agreed with the plan and demonstrated an understanding of the instructions.   They were advised to call back or seek an in-person evaluation in the emergency room if the symptoms worsen or if the condition fails to improve as anticipated.  I provided 15 minutes of non-face-to-face time and 0 minutes of care coordination during this encounter I was located at off site during this encounter.  Andrea Resides, FNP

## 2019-04-02 NOTE — BH Specialist Note (Signed)
Pt NS appointment.

## 2019-04-28 ENCOUNTER — Emergency Department (HOSPITAL_COMMUNITY)
Admission: EM | Admit: 2019-04-28 | Discharge: 2019-04-28 | Disposition: A | Discharge status: Home / Self Care | Payer: Medicaid Other | Attending: Emergency Medicine | Admitting: Emergency Medicine

## 2019-04-28 ENCOUNTER — Emergency Department (HOSPITAL_COMMUNITY): Payer: Medicaid Other

## 2019-04-28 ENCOUNTER — Encounter (HOSPITAL_COMMUNITY): Payer: Self-pay

## 2019-04-28 ENCOUNTER — Other Ambulatory Visit: Payer: Self-pay

## 2019-04-28 DIAGNOSIS — Z87891 Personal history of nicotine dependence: Secondary | ICD-10-CM | POA: Insufficient documentation

## 2019-04-28 DIAGNOSIS — R079 Chest pain, unspecified: Secondary | ICD-10-CM | POA: Diagnosis not present

## 2019-04-28 DIAGNOSIS — Z79899 Other long term (current) drug therapy: Secondary | ICD-10-CM | POA: Diagnosis not present

## 2019-04-28 DIAGNOSIS — R0789 Other chest pain: Secondary | ICD-10-CM | POA: Insufficient documentation

## 2019-04-28 MED ORDER — LIDOCAINE 5 % EX PTCH: 1.0000 | MEDICATED_PATCH | CUTANEOUS | 0 refills | Status: DC

## 2019-04-28 MED ORDER — IBUPROFEN 600 MG PO TABS: 600.0000 mg | ORAL_TABLET | Freq: Four times a day (QID) | ORAL | 0 refills | Status: DC | PRN

## 2019-04-28 NOTE — ED Triage Notes (Signed)
Pt presents with 2 days of sharp right sided chest pain. Pain is worse when pt inhales or moves. Has had this pain before but it usually goes away in couple of hours. Denies SOB, N&V

## 2019-04-28 NOTE — ED Provider Notes (Signed)
[facility] EMERGENCY DEPARTMENT Provider Note   CSN: [id number] Arrival date & time: 04/28/19  1724    History   Chief Complaint Chief Complaint  Patient presents with  . Chest Pain    HPI Andrea Fernandez is a 18 y.o. female with history of generalized anxiety disorder, major depressive disorder presenting for evaluation of acute onset, constant right-sided chest pains for 2 days.  She reports that the pain is sharp, localized to a very focal area to the anterior chest wall and worsens with certain movements or deep inspiration.  She denies any shortness of breath, fevers, cough, nausea, vomiting, lightheadedness, or abdominal pain.  The pain does not radiate.  He has taken Tylenol without relief of her symptoms.  No trauma.  She does smoke marijuana multiple times daily denies any other drug use.  No history of heart disease at a young age.  Denies recent travel or surgeries, hemoptysis, prior history of DVT or PE, leg swelling, or OCP use.     The history is provided by the patient.    History reviewed. No pertinent past medical history.  Patient Active Problem List   Diagnosis Date Noted  . MDD (major depressive disorder), recurrent episode, moderate ([place]) 03/19/2019  . Sleep disturbance 03/19/2019  . Anorexia 03/19/2019  . GAD (generalized anxiety disorder) 03/19/2019  . Iron deficiency anemia due to chronic blood loss 06/26/2018  . Menorrhagia with regular cycle 06/26/2018    History reviewed. No pertinent surgical history.   OB History   No obstetric history on file.      Home Medications    Prior to Admission medications   Medication Sig Start Date End Date Taking? Authorizing Provider  ibuprofen (ADVIL) 600 MG tablet Take 1 tablet (600 mg total) by mouth every 6 (six) hours as needed. 04/28/19   Taj Nevins A, PA-C  lidocaine (LIDODERM) 5 % Place 1 patch onto the skin daily. Remove & Discard patch within 12 hours or as directed by MD 04/28/19    Rodell Perna A, PA-C  sertraline (ZOLOFT) 50 MG tablet Take 1/2 tablet for 3 days. If well tolerated, increase to 1 tablet daily by mouth 03/19/19   Trude Mcburney, FNP    Family History Family History  Problem Relation Age of Onset  . Depression Mother   . Depression Sister     Social History Social History   Tobacco Use  . Smoking status: Former Research scientist (life sciences)  . Smokeless tobacco: Never Used  Substance Use Topics  . Alcohol use: Yes  . Drug use: No     Allergies   Patient has no known allergies.   Review of Systems Review of Systems  Constitutional: Negative for chills and fever.  Respiratory: Negative for cough, shortness of breath and wheezing.   Cardiovascular: Positive for chest pain.  All other systems reviewed and are negative.    Physical Exam Updated Vital Signs BP 123/77 (BP Location: Right Arm)   Pulse 71   Temp 98.1 F (36.7 C)   Ht 5\' 3"  (1.6 m)   Wt 44.5 kg   LMP 04/27/2019   SpO2 100%   BMI 17.36 kg/m   Physical Exam Vitals signs and nursing note reviewed.  Constitutional:      General: She is not in acute distress.    Appearance: She is well-developed.     Comments: Resting comfortably in bed  HENT:     Head: Normocephalic and atraumatic.  Eyes:     General:  Right eye: No discharge.        Left eye: No discharge.     Conjunctiva/sclera: Conjunctivae normal.  Neck:     Musculoskeletal: Normal range of motion and neck supple.     Vascular: No JVD.     Trachea: No tracheal deviation.  Cardiovascular:     Rate and Rhythm: Normal rate and regular rhythm.     Pulses:          Radial pulses are 2+ on the right side and 2+ on the left side.       Dorsalis pedis pulses are 2+ on the right side and 2+ on the left side.       Posterior tibial pulses are 2+ on the right side and 2+ on the left side.     Heart sounds: Normal heart sounds.     Comments: Homans sign absent bilaterally, no lower extremity edema, no palpable cords,  compartments are soft  Pulmonary:     Effort: Pulmonary effort is normal.     Comments: Speaking in full sentences without difficulty. Chest:     Chest wall: Tenderness present. No deformity or crepitus.       Comments: Focal right chest wall tenderness parasternally.  No deformity, crepitus, ecchymosis, or flail segment. Abdominal:     General: There is no distension.     Palpations: Abdomen is soft. There is no mass.     Tenderness: There is no abdominal tenderness.  Musculoskeletal:     Right lower leg: She exhibits no tenderness. No edema.     Left lower leg: She exhibits no tenderness. No edema.  Skin:    General: Skin is warm and dry.     Findings: No erythema.  Neurological:     Mental Status: She is alert.  Psychiatric:        Behavior: Behavior normal.      ED Treatments / Results  Labs (all labs ordered are listed, but only abnormal results are displayed) Labs Reviewed - No data to display  EKG ED ECG REPORT   Date: 04/28/2019  Rate: 72  Rhythm: normal sinus rhythm  QRS Axis: right  Intervals: normal  ST/T Wave abnormalities: normal  Conduction Disutrbances:none  Narrative Interpretation:   Old EKG Reviewed: unchanged  I have personally reviewed the EKG tracing and agree with the computerized printout as noted.   Radiology Dg Chest 2 View  Result Date: 04/28/2019 CLINICAL DATA:  Two day history of chest pain. EXAM: CHEST - 2 VIEW COMPARISON:  03/09/2016 FINDINGS: The heart size and mediastinal contours are within normal limits. Both lungs are clear. The visualized skeletal structures are unremarkable. IMPRESSION: Normal chest x-ray. Electronically Signed   By: Rudie MeyerP.  Gallerani M.D.   On: 04/28/2019 18:51    Procedures Procedures (including critical care time)  Medications Ordered in ED Medications - No data to display   Initial Impression / Assessment and Plan / ED Course  I have reviewed the triage vital signs and the nursing notes.  Pertinent  labs & imaging results that were available during my care of the patient were reviewed by me and considered in my medical decision making (see chart for details).        Patient with right-sided chest pains reproducible on palpation.  It is not exertional.  She is PERC negative and I doubt PE.  EKG shows normal sinus rhythm, mild right axis deviation which is likely secondary to her being young and thin.  No acute  ischemic changes noted.  Chest x-ray shows no acute cardiopulmonary abnormalities.  No infectious symptoms to suggest COVID-19 infection, pneumonia, or URI.  Suspect costochondritis.  We discussed conservative therapy management with NSAIDs, lidocaine patches, heat, gentle stretching.  Recommend follow-up with PCP for reevaluation of symptoms.  Discussed ED return precautions.  Also encouraged smoking cessation. pt verbalized understanding of and agreement with plan and is safe for discharge home at this time.   Final Clinical Impressions(s) / ED Diagnoses   Final diagnoses:  Acute chest wall pain    ED Discharge Orders         Ordered    lidocaine (LIDODERM) 5 %  Every 24 hours     04/28/19 1915    ibuprofen (ADVIL) 600 MG tablet  Every 6 hours PRN     04/28/19 1915           Jeanie SewerFawze, Exa Bomba A, PA-C 04/28/19 1924    Sabas SousBero, Michael M, MD 04/29/19 [phone]

## 2019-04-28 NOTE — ED Notes (Signed)
Patient transported to X-ray 

## 2019-04-28 NOTE — Discharge Instructions (Signed)
Take 400 to 600 mg of ibuprofen every 6 hours as needed for pain.  Take this with food to avoid upset stomach issues.  Take 500 to 1000 mg of Tylenol every 6 hours for pain if the ibuprofen is not working.  You can alternate these medications every 3 hours if you are continuing to have pain.  Apply lidocaine patches to areas of pain.  You can also apply heating pads for 20 minutes at a time 2 or 3 times daily do some gentle stretching.  I would recommend that you quit smoking.  Follow-up with primary care provider for reevaluation of symptoms.  Return to the emergency department if any concerning signs or symptoms develop.

## 2019-05-07 ENCOUNTER — Other Ambulatory Visit: Payer: Self-pay

## 2019-05-07 ENCOUNTER — Ambulatory Visit (INDEPENDENT_AMBULATORY_CARE_PROVIDER_SITE_OTHER): Payer: Medicaid Other | Admitting: Pediatrics

## 2019-05-07 DIAGNOSIS — N898 Other specified noninflammatory disorders of vagina: Secondary | ICD-10-CM | POA: Diagnosis not present

## 2019-05-07 DIAGNOSIS — G479 Sleep disorder, unspecified: Secondary | ICD-10-CM

## 2019-05-07 DIAGNOSIS — R0789 Other chest pain: Secondary | ICD-10-CM | POA: Diagnosis not present

## 2019-05-07 DIAGNOSIS — F411 Generalized anxiety disorder: Secondary | ICD-10-CM

## 2019-05-07 DIAGNOSIS — F331 Major depressive disorder, recurrent, moderate: Secondary | ICD-10-CM | POA: Diagnosis not present

## 2019-05-07 MED ORDER — FLUCONAZOLE 150 MG PO TABS: ORAL_TABLET | ORAL | 0 refills | Status: DC

## 2019-05-07 MED ORDER — LIDOCAINE 5 % EX PTCH: 1.0000 | MEDICATED_PATCH | CUTANEOUS | 0 refills | Status: DC

## 2019-05-07 MED ORDER — SERTRALINE HCL 50 MG PO TABS: 50.0000 mg | ORAL_TABLET | Freq: Every day | ORAL | 1 refills | Status: DC

## 2019-05-07 MED ORDER — IBUPROFEN 800 MG PO TABS: 800.0000 mg | ORAL_TABLET | Freq: Three times a day (TID) | ORAL | 0 refills | Status: DC | PRN

## 2019-05-07 NOTE — Progress Notes (Signed)
Virtual Visit via Video Note  I connected with Andrea Fernandez 's patient  on 05/07/19 at  4:00 PM EDT by a video enabled telemedicine application and verified that I am speaking with the correct person using two identifiers.   Location of patient/parent: At home   I discussed the limitations of evaluation and management by telemedicine and the availability of in person appointments.  I discussed that the purpose of this telehealth visit is to provide medical care while limiting exposure to the novel coronavirus.  The patient expressed understanding and agreed to proceed.  Reason for visit: f/u mood, anxiety, sleep and ER visit for chest pain   History of Present Illness: mood is going well. Denies anxiety and depression sx. Is not doing therapy but continues on medicatoin and plans to do so for the coming future to ensure she continues to feel well.   Visit to the ED for chest pain that was reproducible on exam. They rx for lidocaine patch and ibuprofen, however, they sent to the wrong pharmacy it appears. She has been taking tylenol and heating pads without much success. Character of pain has not changed and denies SOB or other worrisome sx.   Continues to wish she could gain weight. She is eating 3 meals a day, but she often eats a lot of carbs. She can't quanify how much protein or fat she gets, but is usually eating fast food. She is having regular menstrual cycles.   Additionally, she is having thick, white vaginal discharge. She continues to be sexually active with females only. She is not using any toys or other products. She has had similar sx in the past which was a yeast infection.    Observations/Objective:  Normal mood and affect. Appears comfortable and not in distress.   Assessment and Plan:  1. MDD (major depressive disorder), recurrent episode, moderate ([facility]) Continue zoloft daily.  - sertraline (ZOLOFT) 50 MG tablet; Take 1 tablet (50 mg total) by mouth daily.  Dispense: 30  tablet; Refill: 1  2. GAD (generalized anxiety disorder) Continue zoloft   3. Sleep disturbance Sleeping much better with anxiety under control.   4. Right-sided chest wall pain rx for ibuprofen and lidoderm sent to the correct pharmacy. Discussed warning signs and we will f/u in 4 weeks to ensure it has resolved.  - ibuprofen (ADVIL) 800 MG tablet; Take 1 tablet (800 mg total) by mouth every 8 (eight) hours as needed.  Dispense: 30 tablet; Refill: 0 - lidocaine (LIDODERM) 5 %; Place 1 patch onto the skin daily. Remove & Discard patch within 12 hours or as directed by MD  Dispense: 30 patch; Refill: 0  5. Vaginal discharge Will try diflucan. Low risk for STI. If not better, will come to clinic for swabs.    Follow Up Instructions: 4 weeks virtual visit for chest pain    I discussed the assessment and treatment plan with the patient and/or parent/guardian. They were provided an opportunity to ask questions and all were answered. They agreed with the plan and demonstrated an understanding of the instructions.   They were advised to call back or seek an in-person evaluation in the emergency room if the symptoms worsen or if the condition fails to improve as anticipated.  I provided 25 minutes of non-face-to-face time and 0 minutes of care coordination during this encounter I was located at off site during this encounter.  [name], FNP

## 2019-06-11 ENCOUNTER — Ambulatory Visit (INDEPENDENT_AMBULATORY_CARE_PROVIDER_SITE_OTHER): Payer: Medicaid Other | Admitting: Family

## 2019-06-11 DIAGNOSIS — F331 Major depressive disorder, recurrent, moderate: Secondary | ICD-10-CM | POA: Diagnosis not present

## 2019-06-11 DIAGNOSIS — Z20822 Contact with and (suspected) exposure to covid-19: Secondary | ICD-10-CM

## 2019-06-11 DIAGNOSIS — Z20828 Contact with and (suspected) exposure to other viral communicable diseases: Secondary | ICD-10-CM

## 2019-06-11 MED ORDER — SERTRALINE HCL 50 MG PO TABS: 50.0000 mg | ORAL_TABLET | Freq: Every day | ORAL | 0 refills | Status: DC

## 2019-06-11 NOTE — Progress Notes (Signed)
Virtual Visit via Video Note  I connected with Andrea Fernandez  on 06/11/19 at  4:00 PM EDT by a video enabled telemedicine application and verified that I am speaking with the correct person using two identifiers.   Location of patient/parent: home   I discussed the limitations of evaluation and management by telemedicine and the availability of in person appointments.  I discussed that the purpose of this telehealth visit is to provide medical care while limiting exposure to the novel coronavirus.  The patient expressed understanding and agreed to proceed.  Reason for visit:  Medication management for MDD  History of Present Illness:   ACUTE ISSUE:  -bad headaches, dizziness x week , sore throat yesterday; one episode of diarrhea and once nauseous, no fever, cough and nasal congestion  -was around someone who had COVID    -for chest pain - ended up taking ibuprofen with benefit   MDD:  She takes sertraline 50 mg with benefit; acknowledges that she feels much better when she takes it; endorses passive SI, none today and she would never have a plan; feels that the medication definitely helps her with her mood; just difficult to remember to take but she knows she is better with it.    Observations/Objective: she is sitting upright in her home with NAD, no WOB or SOB noted; she is coughing throughout the video; nasal congestion noted.   Assessment and Plan:  1. MDD (major depressive disorder), recurrent episode, moderate ([place]) -continue with sertraline 50 mg  -she may benefit from increased dose; however for now we focused on importance of med compliance to avoid withdrawal symptoms   - sertraline (ZOLOFT) 50 MG tablet; Take 1 tablet (50 mg total) by mouth daily.  Dispense: 90 tablet; Refill: 0  2. Exposure to 2019 Novel Coronavirus -ordered test, reviewed self-quarantine until test results return  -return precautions given including indications for seeking immediate medical care   -advised to use APAP for symptom relief  - Novel Coronavirus, NAA ([facility])   Follow Up Instructions: pending her lab results; within 4 week for med monitoring    I discussed the assessment and treatment plan with the patient and/or parent/guardian. They were provided an opportunity to ask questions and all were answered. They agreed with the plan and demonstrated an understanding of the instructions.   They were advised to call back or seek an in-person evaluation in the emergency room if the symptoms worsen or if the condition fails to improve as anticipated.  I spent 15 minutes on this telehealth visit inclusive of face-to-face video and care coordination time I was located remote during this encounter.  Parthenia Ames, NP

## 2019-06-12 ENCOUNTER — Encounter: Payer: Self-pay | Admitting: Family

## 2019-06-13 ENCOUNTER — Encounter: Payer: Self-pay | Admitting: Family

## 2019-07-25 ENCOUNTER — Encounter: Payer: Self-pay | Admitting: Family

## 2019-08-18 ENCOUNTER — Ambulatory Visit (INDEPENDENT_AMBULATORY_CARE_PROVIDER_SITE_OTHER): Payer: Medicaid Other | Admitting: Pediatrics

## 2019-08-18 ENCOUNTER — Other Ambulatory Visit: Payer: Self-pay

## 2019-08-18 ENCOUNTER — Encounter: Payer: Self-pay | Admitting: Family

## 2019-08-18 DIAGNOSIS — H6501 Acute serous otitis media, right ear: Secondary | ICD-10-CM | POA: Diagnosis not present

## 2019-08-18 MED ORDER — FLUCONAZOLE 150 MG PO TABS: ORAL_TABLET | ORAL | 0 refills | Status: DC

## 2019-08-18 MED ORDER — AMOXICILLIN-POT CLAVULANATE 875-125 MG PO TABS: 1.0000 | ORAL_TABLET | Freq: Two times a day (BID) | ORAL | 0 refills | Status: AC

## 2019-08-18 NOTE — Progress Notes (Signed)
Virtual Visit via Video Note  I connected with Andrea Fernandez 's patient  on 08/18/19 at  2:30 PM EDT by a video enabled telemedicine application and verified that I am speaking with the correct person using two identifiers.   Location of patient/parent: At home   I discussed the limitations of evaluation and management by telemedicine and the availability of in person appointments.  I discussed that the purpose of this telehealth visit is to provide medical care while limiting exposure to the novel coronavirus.  The patient expressed understanding and agreed to proceed.  Reason for visit: sore throat, ear pain, neck pain   History of Present Illness:  2 days ago woke up with severe sore throat. She is also having some pain in lymph nodes. When she swallows or eats it is painful. Denies fever. Denies headache. Denies N/V/D. Denies exposure to sick contacts. She can eat, but it hurts. Has been taking some ibuprofen and is using some OTC ear drops. Denies nasal congestion.    Observations/Objective:  Physical Exam  Constitutional: She is oriented to person, place, and time and well-developed, well-nourished, and in no distress.  HENT:  Head: Normocephalic.  Nose: Nose normal.  Mouth/Throat: Mucous membranes are normal. Posterior oropharyngeal erythema present. No oropharyngeal exudate.  Eyes: Pupils are equal, round, and reactive to light. Conjunctivae are normal.  Neck: Normal range of motion.  Pulmonary/Chest: Effort normal.  Musculoskeletal: Normal range of motion.  Neurological: She is alert and oriented to person, place, and time.  Psychiatric: Mood and affect normal.     Assessment and Plan:  1. Non-recurrent acute serous otitis media of right ear Given one sided pain + what sounds like LAD, will tx with 5 days of augmentin for AOM. No nasal sx to suggest more viral etiology.  - amoxicillin-clavulanate (AUGMENTIN) 875-125 MG tablet; Take 1 tablet by mouth 2 (two) times daily for 5  days.  Dispense: 10 tablet; Refill: 0   Follow Up Instructions: advised to call back if no improvement    I discussed the assessment and treatment plan with the patient and/or parent/guardian. They were provided an opportunity to ask questions and all were answered. They agreed with the plan and demonstrated an understanding of the instructions.   They were advised to call back or seek an in-person evaluation in the emergency room if the symptoms worsen or if the condition fails to improve as anticipated.  I spent 10 minutes on this telehealth visit inclusive of face-to-face video and care coordination time I was located at off site during this encounter.  [name], FNP

## 2020-01-23 ENCOUNTER — Encounter: Payer: Self-pay | Admitting: Family

## 2020-01-26 ENCOUNTER — Other Ambulatory Visit: Payer: Self-pay | Admitting: Family

## 2020-01-26 ENCOUNTER — Encounter: Payer: Self-pay | Admitting: Family

## 2020-01-26 DIAGNOSIS — Z7689 Persons encountering health services in other specified circumstances: Secondary | ICD-10-CM

## 2020-02-18 DIAGNOSIS — H5213 Myopia, bilateral: Secondary | ICD-10-CM | POA: Diagnosis not present

## 2020-02-18 DIAGNOSIS — H52223 Regular astigmatism, bilateral: Secondary | ICD-10-CM | POA: Diagnosis not present

## 2020-03-04 DIAGNOSIS — H5213 Myopia, bilateral: Secondary | ICD-10-CM | POA: Diagnosis not present

## 2020-03-07 ENCOUNTER — Other Ambulatory Visit: Payer: Self-pay

## 2020-03-07 ENCOUNTER — Encounter (HOSPITAL_COMMUNITY): Payer: Self-pay | Admitting: Emergency Medicine

## 2020-03-07 ENCOUNTER — Emergency Department (HOSPITAL_COMMUNITY)
Admission: EM | Admit: 2020-03-07 | Discharge: 2020-03-07 | Disposition: A | Discharge status: Home / Self Care | Payer: Medicaid Other | Attending: Emergency Medicine | Admitting: Emergency Medicine

## 2020-03-07 DIAGNOSIS — Y939 Activity, unspecified: Secondary | ICD-10-CM | POA: Insufficient documentation

## 2020-03-07 DIAGNOSIS — Y929 Unspecified place or not applicable: Secondary | ICD-10-CM | POA: Diagnosis not present

## 2020-03-07 DIAGNOSIS — Y999 Unspecified external cause status: Secondary | ICD-10-CM | POA: Diagnosis not present

## 2020-03-07 DIAGNOSIS — S0502XA Injury of conjunctiva and corneal abrasion without foreign body, left eye, initial encounter: Secondary | ICD-10-CM | POA: Diagnosis not present

## 2020-03-07 DIAGNOSIS — Z87891 Personal history of nicotine dependence: Secondary | ICD-10-CM | POA: Diagnosis not present

## 2020-03-07 DIAGNOSIS — X58XXXA Exposure to other specified factors, initial encounter: Secondary | ICD-10-CM | POA: Diagnosis not present

## 2020-03-07 DIAGNOSIS — Z79899 Other long term (current) drug therapy: Secondary | ICD-10-CM | POA: Insufficient documentation

## 2020-03-07 DIAGNOSIS — S0592XA Unspecified injury of left eye and orbit, initial encounter: Secondary | ICD-10-CM | POA: Diagnosis present

## 2020-03-07 MED ORDER — POLYMYXIN B-TRIMETHOPRIM 10000-0.1 UNIT/ML-% OP SOLN: 2.0000 [drp] | Freq: Four times a day (QID) | OPHTHALMIC | 0 refills | Status: DC

## 2020-03-07 MED ORDER — FLUORESCEIN SODIUM 1 MG OP STRP
1.0000 | ORAL_STRIP | Freq: Once | OPHTHALMIC | Status: AC
  Administered 2020-03-07: 12:00:00 1 via OPHTHALMIC
  Filled 2020-03-07: qty 1

## 2020-03-07 MED ORDER — TETRACAINE HCL 0.5 % OP SOLN
2.0000 [drp] | Freq: Once | OPHTHALMIC | Status: AC
  Administered 2020-03-07: 12:00:00 2 [drp] via OPHTHALMIC
  Filled 2020-03-07: qty 4

## 2020-03-07 NOTE — ED Provider Notes (Signed)
[facility] EMERGENCY DEPARTMENT Provider Note   CSN: [id number] Arrival date & time: 03/07/20  1050     History Chief Complaint  Patient presents with  . Eye Pain    Andrea Fernandez is a 19 y.o. female.  Patient is a 19 year old female with no significant past medical history who wears glasses and not contacts presenting to the emergency department for left eye irritation.  Reports that she woke up at 5 AM this morning and her left eye was red and tearful and feels like there is something in it.  Denies any known foreign bodies.  Denies any other symptoms including photophobia, difficulty seeing, purulent drainage, other URI symptoms        History reviewed. No pertinent past medical history.  Patient Active Problem List   Diagnosis Date Noted  . MDD (major depressive disorder), recurrent episode, moderate (HCC) 03/19/2019  . Sleep disturbance 03/19/2019  . Anorexia 03/19/2019  . GAD (generalized anxiety disorder) 03/19/2019  . Iron deficiency anemia due to chronic blood loss 06/26/2018  . Menorrhagia with regular cycle 06/26/2018    History reviewed. No pertinent surgical history.   OB History   No obstetric history on file.     Family History  Problem Relation Age of Onset  . Depression Mother   . Depression Sister     Social History   Tobacco Use  . Smoking status: Former Games developer  . Smokeless tobacco: Never Used  Substance Use Topics  . Alcohol use: Yes  . Drug use: Yes    Types: Marijuana    Home Medications Prior to Admission medications   Medication Sig Start Date End Date Taking? Authorizing Provider  fluconazole (DIFLUCAN) 150 MG tablet Take 1 tablet today and 1 tablet 3 days from now 08/18/19   Alfonso Ramus T, FNP  ibuprofen (ADVIL) 800 MG tablet Take 1 tablet (800 mg total) by mouth every 8 (eight) hours as needed. 05/07/19   Verneda Skill, FNP  sertraline (ZOLOFT) 50 MG tablet Take 1 tablet (50 mg total) by mouth  daily. 06/11/19   Georges Mouse, NP  trimethoprim-polymyxin b (POLYTRIM) ophthalmic solution Place 2 drops into the left eye every 6 (six) hours for 7 days. 03/07/20 03/14/20  Arlyn Dunning, PA-C    Allergies    Patient has no known allergies.  Review of Systems   Review of Systems  Constitutional: Negative for chills and fever.  HENT: Negative for congestion, ear pain and sore throat.   Eyes: Positive for pain, discharge, redness and itching. Negative for photophobia and visual disturbance.  Respiratory: Negative for cough.   Neurological: Negative for dizziness and headaches.    Physical Exam Updated Vital Signs BP (!) 119/58 (BP Location: Right Arm)   Pulse 75   Temp 98.6 F (37 C) (Oral)   Resp 16   Ht 5\' 4"  (1.626 m)   Wt 50.3 kg   LMP 02/22/2020   SpO2 100%   BMI 19.05 kg/m   Physical Exam Vitals and nursing note reviewed.  Constitutional:      General: She is not in acute distress.    Appearance: Normal appearance. She is not ill-appearing, toxic-appearing or diaphoretic.  HENT:     Head: Normocephalic.  Eyes:     General: Lids are everted, no foreign bodies appreciated. Vision grossly intact.        Right eye: No foreign body, discharge or hordeolum.        Left eye: No foreign  body, discharge or hordeolum.     Extraocular Movements: Extraocular movements intact.     Right eye: Normal extraocular motion and no nystagmus.     Left eye: Normal extraocular motion and no nystagmus.     Conjunctiva/sclera:     Right eye: Right conjunctiva is not injected. No chemosis, exudate or hemorrhage.    Left eye: Left conjunctiva is injected. No chemosis, exudate or hemorrhage.    Pupils:     Left eye: Fluorescein uptake present.   Cardiovascular:     Rate and Rhythm: Normal rate.  Pulmonary:     Effort: Pulmonary effort is normal.  Skin:    General: Skin is dry.  Neurological:     Mental Status: She is alert.  Psychiatric:        Mood and Affect: Mood normal.      ED Results / Procedures / Treatments   Labs (all labs ordered are listed, but only abnormal results are displayed) Labs Reviewed - No data to display  EKG None  Radiology No results found.  Procedures Procedures (including critical care time)  Medications Ordered in ED Medications  tetracaine (PONTOCAINE) 0.5 % ophthalmic solution 2 drop (2 drops Left Eye Given 03/07/20 1226)  fluorescein ophthalmic strip 1 strip (1 strip Left Eye Given by Other 03/07/20 1226)    ED Course  I have reviewed the triage vital signs and the nursing notes.  Pertinent labs & imaging results that were available during my care of the patient were reviewed by me and considered in my medical decision making (see chart for details).    MDM Rules/Calculators/A&P                      Based on review of vitals, medical screening exam, lab work and/or imaging, there does not appear to be an acute, emergent etiology for the patient's symptoms. Counseled pt on good return precautions and encouraged both PCP and ED follow-up as needed.  Prior to discharge, I also discussed incidental imaging findings with patient in detail and advised appropriate, recommended follow-up in detail.  Clinical Impression: 1. Abrasion of left cornea, initial encounter     Disposition: Discharge  Prior to providing a prescription for a controlled substance, I independently reviewed the patient's recent prescription history on the [facility]. The patient had no recent or regular prescriptions and was deemed appropriate for a brief, less than 3 day prescription of narcotic for acute analgesia.  This note was prepared with assistance of Systems analyst. Occasional wrong-word or sound-a-like substitutions may have occurred due to the inherent limitations of voice recognition software.  Final Clinical Impression(s) / ED Diagnoses Final diagnoses:  Abrasion of left  cornea, initial encounter    Rx / DC Orders ED Discharge Orders         Ordered    trimethoprim-polymyxin b (POLYTRIM) ophthalmic solution  Every 6 hours     03/07/20 1234           [name] 03/07/20 1235    Isla Pence, MD 03/09/20 1820

## 2020-03-07 NOTE — Discharge Instructions (Signed)
Thank you for allowing me to care for you today. Please return to the emergency department if you have new or worsening symptoms. Take your medications as instructed.  ° °

## 2020-03-07 NOTE — ED Triage Notes (Signed)
C/o L eye burning and redness since 5am.

## 2020-03-08 ENCOUNTER — Other Ambulatory Visit (HOSPITAL_COMMUNITY)
Admission: RE | Admit: 2020-03-08 | Discharge: 2020-03-08 | Disposition: A | Discharge status: Home / Self Care | Payer: Medicaid Other | Source: Ambulatory Visit | Attending: Pediatrics | Admitting: Pediatrics

## 2020-03-08 ENCOUNTER — Ambulatory Visit (INDEPENDENT_AMBULATORY_CARE_PROVIDER_SITE_OTHER): Payer: Medicaid Other | Admitting: Pediatrics

## 2020-03-08 ENCOUNTER — Encounter: Payer: Self-pay | Admitting: Pediatrics

## 2020-03-08 VITALS — BP 118/73 | HR 68 | Ht 64.0 in | Wt 106.8 lb

## 2020-03-08 DIAGNOSIS — G479 Sleep disorder, unspecified: Secondary | ICD-10-CM

## 2020-03-08 DIAGNOSIS — R634 Abnormal weight loss: Secondary | ICD-10-CM | POA: Diagnosis not present

## 2020-03-08 DIAGNOSIS — Z1321 Encounter for screening for nutritional disorder: Secondary | ICD-10-CM

## 2020-03-08 DIAGNOSIS — Z113 Encounter for screening for infections with a predominantly sexual mode of transmission: Secondary | ICD-10-CM

## 2020-03-08 DIAGNOSIS — F331 Major depressive disorder, recurrent, moderate: Secondary | ICD-10-CM | POA: Diagnosis not present

## 2020-03-08 DIAGNOSIS — Z3202 Encounter for pregnancy test, result negative: Secondary | ICD-10-CM

## 2020-03-08 DIAGNOSIS — Z7689 Persons encountering health services in other specified circumstances: Secondary | ICD-10-CM

## 2020-03-08 DIAGNOSIS — D500 Iron deficiency anemia secondary to blood loss (chronic): Secondary | ICD-10-CM

## 2020-03-08 DIAGNOSIS — F411 Generalized anxiety disorder: Secondary | ICD-10-CM

## 2020-03-08 DIAGNOSIS — F1010 Alcohol abuse, uncomplicated: Secondary | ICD-10-CM | POA: Diagnosis not present

## 2020-03-08 LAB — CBC
HCT: 45.3 % — ABNORMAL HIGH (ref 35.0–45.0)
Hemoglobin: 14.6 g/dL (ref 11.7–15.5)
MCH: 26.8 pg — ABNORMAL LOW (ref 27.0–33.0)
MCHC: 32.2 g/dL (ref 32.0–36.0)
MCV: 83.3 fL (ref 80.0–100.0)
MPV: 11.9 fL (ref 7.5–12.5)
Platelets: 342 10*3/uL (ref 140–400)
RBC: 5.44 10*6/uL — ABNORMAL HIGH (ref 3.80–5.10)
RDW: 13.1 % (ref 11.0–15.0)
WBC: 4.3 10*3/uL (ref 3.8–10.8)

## 2020-03-08 MED ORDER — SERTRALINE HCL 50 MG PO TABS: 50.0000 mg | ORAL_TABLET | Freq: Every day | ORAL | 0 refills | Status: DC

## 2020-03-08 NOTE — Progress Notes (Signed)
History was provided by the patient.  Andrea Fernandez is a 19 y.o. female who is here for MDD follow-up.   PCP confirmed? Yes.    Trude Mcburney, FNP  HPI:  Andrea Fernandez is a 19yo AFAB-IAF, here for follow up of MDD. Had been taking zoloft 50mg  until 1 month ago when she ran out. Mood has been more depressed, denies SI. Difficulties with sleep initiation, sleep maintenance, and early morning awakenings. Yesterday, went to sleep 11p-12a, woke up at 5a. No snoring. Does not have a therapist, not interested in one yet.  Feels like her PO intake has been poor over the past week, which she notices when her mood is more down. Goal weight, more than 111lbs. Tries to eat 3 meals, but sometimes doesn't in context of mood and work. Works morning shift.   Does endorse cold intolerance, history of iron deficiency. Not taking iron currently. LMP 4/16, heavy for two days, lasts 4 days. Sexually active with female partners.   Does endorse intermittent heavy drinking, ~1 pint of tequila a few times a week. No history of withdrawals, CAGE negative. Motivated to cut back. Discussed moderate drinking amount for women of 1 drink/day.  Review of Systems  Constitutional: Negative for chills, fever and weight loss.  HENT: Negative for sore throat.   Eyes: Positive for pain. Negative for redness.  Respiratory: Negative for cough.   Cardiovascular: Positive for chest pain. Negative for palpitations.  Gastrointestinal: Negative for constipation, diarrhea and vomiting.  Genitourinary: Negative for dysuria.  Skin: Negative for rash.  Neurological: Negative for headaches.  Psychiatric/Behavioral: Positive for depression and substance abuse. Negative for suicidal ideas. The patient has insomnia.     Patient Active Problem List   Diagnosis Date Noted  . MDD (major depressive disorder), recurrent episode, moderate ([facility]) 03/19/2019  . Sleep disturbance 03/19/2019  . Anorexia 03/19/2019  . GAD (generalized anxiety  disorder) 03/19/2019  . Iron deficiency anemia due to chronic blood loss 06/26/2018  . Menorrhagia with regular cycle 06/26/2018    No current outpatient medications on file prior to visit.   No current facility-administered medications on file prior to visit.    No Known Allergies  Physical Exam:    Vitals:   03/08/20 0921  BP: 118/73  Pulse: 68  Weight: 106 lb 12.8 oz (48.4 kg)  Height: 5\' 4"  (1.626 m)   Wt Readings from Last 3 Encounters:  03/08/20 106 lb 12.8 oz (48.4 kg)  03/07/20 111 lb (50.3 kg)  04/28/19 98 lb (44.5 kg)    Growth percentile SmartLinks can only be used for patients less than 74 years old. Patient's last menstrual period was 02/22/2020.  Physical Exam Vitals reviewed.  Constitutional:      General: She is not in acute distress.    Appearance: Normal appearance. She is normal weight.  HENT:     Head: Normocephalic and atraumatic.     Nose: Nose normal.  Cardiovascular:     Rate and Rhythm: Normal rate and regular rhythm.     Pulses: Normal pulses.     Heart sounds: No murmur.  Pulmonary:     Effort: Pulmonary effort is normal. No respiratory distress.     Breath sounds: Normal breath sounds.  Abdominal:     General: Abdomen is flat. Bowel sounds are normal.     Palpations: Abdomen is soft.     Comments: No HSM  Musculoskeletal:        General: Normal range of motion.  Cervical back: Normal range of motion. No tenderness.  Lymphadenopathy:     Cervical: No cervical adenopathy.  Skin:    General: Skin is warm.     Capillary Refill: Capillary refill takes less than 2 seconds.  Neurological:     Mental Status: She is alert and oriented to person, place, and time. Mental status is at baseline.  Psychiatric:        Mood and Affect: Mood normal.        Behavior: Behavior normal.        Thought Content: Thought content normal.        Judgment: Judgment normal.     PHQ-SADS Last 3 Score only 03/08/2020 03/19/2019  PHQ-15 Score 16 19   Total GAD-7 Score 9 16  Score 20 22    Assessment/Plan: 19yo here for recurrence of her MDD and routine care. History notable for excessive alcohol intake. Patient motivated to cut back, will benefit from close follow up as zoloft is restarted to assist with behavioral change.  1. Routine screening for STI (sexually transmitted infection) - Urine cytology ancillary only - WET PREP BY MOLECULAR PROBE  2. Pregnancy examination or test, negative result - POCT urine pregnancy  3. MDD (major depressive disorder), recurrent episode, moderate (HCC) Sleep disturbance - not intersted in connecting with therapist at this time - sertraline (ZOLOFT) 50 MG tablet; Take 1 tablet (50 mg total) by mouth daily.  Dispense: 90 tablet; Refill: 0 - Ambulatory referral to [facility]  4. Weight loss: Consistent with prior history of worsening depression, but will evaluate LFT and TFTs given history. - Ambulatory referral to Family Practice - Thyroid Panel With TSH - Comprehensive metabolic panel - Ferritin - CBC  5. Excessive drinking of alcohol: Motivated for change. - Ambulatory referral to Family Practice - Comprehensive metabolic panel - recommended starting MV + folic acid  6. Iron deficiency anemia due to chronic blood loss History of anemia, will repeat today - CBC  Follow up in 1 month virtual visit to monitor symptoms.

## 2020-03-09 LAB — WET PREP BY MOLECULAR PROBE
Candida species: NOT DETECTED
Gardnerella vaginalis: NOT DETECTED
MICRO NUMBER:: 10405471
SPECIMEN QUALITY:: ADEQUATE
Trichomonas vaginosis: NOT DETECTED

## 2020-03-09 LAB — FERRITIN: Ferritin: 7 ng/mL — ABNORMAL LOW (ref 16–154)

## 2020-03-09 LAB — COMPREHENSIVE METABOLIC PANEL
AG Ratio: 1.5 (calc) (ref 1.0–2.5)
ALT: 11 U/L (ref 6–29)
AST: 13 U/L (ref 10–30)
Albumin: 4.6 g/dL (ref 3.6–5.1)
Alkaline phosphatase (APISO): 47 U/L (ref 31–125)
BUN: 10 mg/dL (ref 7–25)
CO2: 24 mmol/L (ref 20–32)
Calcium: 10.4 mg/dL — ABNORMAL HIGH (ref 8.6–10.2)
Chloride: 104 mmol/L (ref 98–110)
Creat: 0.87 mg/dL (ref 0.50–1.10)
Globulin: 3.1 g/dL (calc) (ref 1.9–3.7)
Glucose, Bld: 85 mg/dL (ref 65–99)
Potassium: 4.1 mmol/L (ref 3.5–5.3)
Sodium: 137 mmol/L (ref 135–146)
Total Bilirubin: 1.2 mg/dL (ref 0.2–1.2)
Total Protein: 7.7 g/dL (ref 6.1–8.1)

## 2020-03-09 LAB — THYROID PANEL WITH TSH
Free Thyroxine Index: 2.4 (ref 1.4–3.8)
T3 Uptake: 29 % (ref 22–35)
T4, Total: 8.3 ug/dL (ref 5.1–11.9)
TSH: 0.49 mIU/L

## 2020-03-09 NOTE — Progress Notes (Signed)
I have reviewed the resident's note and plan of care and helped develop the plan as necessary.  Will get connected with primary care [place] who can manage ongoing zoloft and adult wellness needs. She was in agreement.   Will see her in 4 weeks for med f/u.   Labs today to eval for specific organic causes of weight loss and cold intolerance.   Alfonso Ramus, FNP

## 2020-03-10 ENCOUNTER — Other Ambulatory Visit: Payer: Self-pay | Admitting: Pediatrics

## 2020-03-10 MED ORDER — FERROUS FUMARATE 325 (106 FE) MG PO TABS: 1.0000 | ORAL_TABLET | Freq: Every day | ORAL | 3 refills | Status: DC

## 2020-03-11 ENCOUNTER — Encounter (HOSPITAL_COMMUNITY): Payer: Self-pay

## 2020-03-11 ENCOUNTER — Other Ambulatory Visit: Payer: Self-pay | Admitting: Pediatrics

## 2020-03-11 ENCOUNTER — Other Ambulatory Visit: Payer: Self-pay

## 2020-03-11 ENCOUNTER — Ambulatory Visit (HOSPITAL_COMMUNITY)
Admission: EM | Admit: 2020-03-11 | Discharge: 2020-03-11 | Disposition: A | Discharge status: Home / Self Care | Payer: Medicaid Other | Attending: Urgent Care | Admitting: Urgent Care

## 2020-03-11 DIAGNOSIS — Z87891 Personal history of nicotine dependence: Secondary | ICD-10-CM | POA: Diagnosis not present

## 2020-03-11 DIAGNOSIS — Z79899 Other long term (current) drug therapy: Secondary | ICD-10-CM | POA: Insufficient documentation

## 2020-03-11 DIAGNOSIS — R070 Pain in throat: Secondary | ICD-10-CM

## 2020-03-11 DIAGNOSIS — B349 Viral infection, unspecified: Secondary | ICD-10-CM

## 2020-03-11 DIAGNOSIS — R0981 Nasal congestion: Secondary | ICD-10-CM | POA: Diagnosis not present

## 2020-03-11 DIAGNOSIS — J029 Acute pharyngitis, unspecified: Secondary | ICD-10-CM | POA: Diagnosis not present

## 2020-03-11 DIAGNOSIS — Z20822 Contact with and (suspected) exposure to covid-19: Secondary | ICD-10-CM | POA: Insufficient documentation

## 2020-03-11 LAB — URINE CYTOLOGY ANCILLARY ONLY
Bacterial Vaginitis-Urine: NEGATIVE
Candida Urine: NEGATIVE
Chlamydia: NEGATIVE
Comment: NEGATIVE
Comment: NEGATIVE
Comment: NORMAL
Neisseria Gonorrhea: NEGATIVE
Trichomonas: POSITIVE — AB

## 2020-03-11 LAB — POCT RAPID STREP A: Streptococcus, Group A Screen (Direct): NEGATIVE

## 2020-03-11 MED ORDER — PSEUDOEPHEDRINE HCL 60 MG PO TABS: 60.0000 mg | ORAL_TABLET | Freq: Three times a day (TID) | ORAL | 0 refills | Status: DC | PRN

## 2020-03-11 MED ORDER — METRONIDAZOLE 500 MG PO TABS: 2000.0000 mg | ORAL_TABLET | Freq: Once | ORAL | 0 refills | Status: AC

## 2020-03-11 MED ORDER — NAPROXEN 375 MG PO TABS: 375.0000 mg | ORAL_TABLET | Freq: Two times a day (BID) | ORAL | 0 refills | Status: DC

## 2020-03-11 MED ORDER — CETIRIZINE HCL 10 MG PO TABS: 10.0000 mg | ORAL_TABLET | Freq: Every day | ORAL | 0 refills | Status: DC

## 2020-03-11 NOTE — Discharge Instructions (Addendum)

## 2020-03-11 NOTE — ED Triage Notes (Signed)
Pt states she has a sore throat x 2 days.

## 2020-03-11 NOTE — ED Notes (Signed)
I had to access patient's chart to obtain the throat culture, patient was discharged before obtaining the orders

## 2020-03-11 NOTE — ED Provider Notes (Signed)
[facility]   MRN: [id number] DOB: 05-13-2001  Subjective:   Andrea Fernandez is a 19 y.o. female presenting for 2 day hx of acute onset throat pain, stuffy nose. Has not tried medications for relief. Does not have hx of allergies, hx of throat infections.   No current facility-administered medications for this encounter.  Current Outpatient Medications:  .  ferrous fumarate (HEMOCYTE - 106 MG FE) 325 (106 Fe) MG TABS tablet, Take 1 tablet (106 mg of iron total) by mouth daily., Disp: 30 tablet, Rfl: 3 .  metroNIDAZOLE (FLAGYL) 500 MG tablet, Take 4 tablets (2,000 mg total) by mouth once for 1 dose. (Patient not taking: Reported on 03/11/2020), Disp: 4 tablet, Rfl: 0 .  sertraline (ZOLOFT) 50 MG tablet, Take 1 tablet (50 mg total) by mouth daily., Disp: 90 tablet, Rfl: 0   No Known Allergies  History reviewed. No pertinent past medical history.   History reviewed. No pertinent surgical history.  Family History  Problem Relation Age of Onset  . Depression Mother   . Depression Sister     Social History   Tobacco Use  . Smoking status: Former Games developer  . Smokeless tobacco: Never Used  Substance Use Topics  . Alcohol use: Yes  . Drug use: Yes    Types: Marijuana    Review of Systems  Constitutional: Negative for fever and malaise/fatigue.  HENT: Positive for congestion and sore throat. Negative for ear pain and sinus pain.   Eyes: Negative for discharge and redness.  Respiratory: Negative for cough, hemoptysis, shortness of breath and wheezing.   Cardiovascular: Negative for chest pain.  Gastrointestinal: Negative for abdominal pain, diarrhea, nausea and vomiting.  Genitourinary: Negative for dysuria, flank pain and hematuria.  Musculoskeletal: Negative for myalgias.  Skin: Negative for rash.  Neurological: Negative for dizziness, weakness and headaches.  Psychiatric/Behavioral: Negative for depression and substance abuse.     Objective:   Vitals: BP 118/70  (BP Location: Right Arm)   Pulse 66   Temp 98.9 F (37.2 C) (Oral)   Resp 16   Wt 104 lb (47.2 kg)   LMP 02/29/2020   SpO2 100%   BMI 17.85 kg/m   Physical Exam Constitutional:      General: She is not in acute distress.    Appearance: Normal appearance. She is well-developed. She is not ill-appearing, toxic-appearing or diaphoretic.  HENT:     Head: Normocephalic and atraumatic.     Right Ear: Tympanic membrane and ear canal normal. No drainage or tenderness. No middle ear effusion. Tympanic membrane is not erythematous.     Left Ear: Tympanic membrane and ear canal normal. No drainage or tenderness.  No middle ear effusion. Tympanic membrane is not erythematous.     Nose: Nose normal. No congestion or rhinorrhea.     Mouth/Throat:     Mouth: Mucous membranes are moist. No oral lesions.     Pharynx: No pharyngeal swelling, oropharyngeal exudate, posterior oropharyngeal erythema or uvula swelling.     Tonsils: No tonsillar exudate or tonsillar abscesses.     Comments: Post-nasal drainage overlying oropharynx. Eyes:     Extraocular Movements: Extraocular movements intact.     Right eye: Normal extraocular motion.     Left eye: Normal extraocular motion.     Conjunctiva/sclera: Conjunctivae normal.     Pupils: Pupils are equal, round, and reactive to light.  Cardiovascular:     Rate and Rhythm: Normal rate and regular rhythm.     Pulses: Normal  pulses.     Heart sounds: Normal heart sounds. No murmur. No friction rub. No gallop.   Pulmonary:     Effort: Pulmonary effort is normal. No respiratory distress.     Breath sounds: Normal breath sounds. No stridor. No wheezing, rhonchi or rales.  Musculoskeletal:     Cervical back: Normal range of motion and neck supple.  Lymphadenopathy:     Cervical: No cervical adenopathy.  Skin:    General: Skin is warm and dry.     Findings: No rash.  Neurological:     General: No focal deficit present.     Mental Status: She is alert and  oriented to person, place, and time.  Psychiatric:        Mood and Affect: Mood normal.        Behavior: Behavior normal.        Thought Content: Thought content normal.    Negative on visual inspection.   Assessment and Plan :   PDMP not reviewed this encounter.  1. Sore throat   2. Stuffy nose   3. Viral illness     Will manage for viral illness such as viral pharyngitis, viral URI, viral syndrome, viral rhinitis, COVID-19. Counseled patient on nature of COVID-19 including modes of transmission, diagnostic testing, management and supportive care.  Offered symptomatic relief. COVID 19 testing is pending. Counseled patient on potential for adverse effects with medications prescribed/recommended today, ER and return-to-clinic precautions discussed, patient verbalized understanding.     Jaynee Eagles, PA-C 03/11/20 1400

## 2020-03-12 LAB — SARS CORONAVIRUS 2 (TAT 6-24 HRS): SARS Coronavirus 2: NEGATIVE

## 2020-03-13 LAB — CULTURE, GROUP A STREP (THRC)

## 2020-03-16 ENCOUNTER — Other Ambulatory Visit: Payer: Self-pay | Admitting: Pediatrics

## 2020-03-16 DIAGNOSIS — A599 Trichomoniasis, unspecified: Secondary | ICD-10-CM

## 2020-03-16 MED ORDER — METRONIDAZOLE 500 MG PO TABS: 2000.0000 mg | ORAL_TABLET | Freq: Once | ORAL | 0 refills | Status: AC

## 2020-04-01 DIAGNOSIS — Z03818 Encounter for observation for suspected exposure to other biological agents ruled out: Secondary | ICD-10-CM | POA: Diagnosis not present

## 2020-04-01 DIAGNOSIS — H5213 Myopia, bilateral: Secondary | ICD-10-CM | POA: Diagnosis not present

## 2020-04-05 ENCOUNTER — Ambulatory Visit: Payer: Medicaid Other | Admitting: Pediatrics

## 2020-04-09 ENCOUNTER — Encounter: Payer: Self-pay | Admitting: Pediatrics

## 2020-04-16 DIAGNOSIS — Z03818 Encounter for observation for suspected exposure to other biological agents ruled out: Secondary | ICD-10-CM | POA: Diagnosis not present

## 2020-04-30 DIAGNOSIS — Z03818 Encounter for observation for suspected exposure to other biological agents ruled out: Secondary | ICD-10-CM | POA: Diagnosis not present

## 2020-06-14 ENCOUNTER — Ambulatory Visit: Payer: Medicaid Other | Admitting: Family

## 2020-07-12 ENCOUNTER — Ambulatory Visit (INDEPENDENT_AMBULATORY_CARE_PROVIDER_SITE_OTHER): Payer: Medicaid Other | Admitting: Family

## 2020-07-12 ENCOUNTER — Encounter: Payer: Self-pay | Admitting: Pediatrics

## 2020-07-12 ENCOUNTER — Other Ambulatory Visit: Payer: Self-pay

## 2020-07-12 VITALS — BP 118/84 | HR 65 | Ht 63.78 in | Wt 109.0 lb

## 2020-07-12 DIAGNOSIS — Z3202 Encounter for pregnancy test, result negative: Secondary | ICD-10-CM | POA: Diagnosis not present

## 2020-07-12 DIAGNOSIS — R1900 Intra-abdominal and pelvic swelling, mass and lump, unspecified site: Secondary | ICD-10-CM | POA: Diagnosis not present

## 2020-07-12 DIAGNOSIS — N898 Other specified noninflammatory disorders of vagina: Secondary | ICD-10-CM | POA: Diagnosis not present

## 2020-07-12 DIAGNOSIS — Z113 Encounter for screening for infections with a predominantly sexual mode of transmission: Secondary | ICD-10-CM

## 2020-07-12 NOTE — Patient Instructions (Signed)
I will call you with results from today's screening.

## 2020-07-13 ENCOUNTER — Encounter: Payer: Self-pay | Admitting: Family

## 2020-07-13 ENCOUNTER — Other Ambulatory Visit: Payer: Self-pay | Admitting: Family

## 2020-07-13 LAB — WET PREP BY MOLECULAR PROBE
Candida species: NOT DETECTED
MICRO NUMBER:: 10888040
SPECIMEN QUALITY:: ADEQUATE
Trichomonas vaginosis: NOT DETECTED

## 2020-07-13 LAB — HIV ANTIBODY (ROUTINE TESTING W REFLEX): HIV 1&2 Ab, 4th Generation: NONREACTIVE

## 2020-07-13 LAB — RPR: RPR Ser Ql: NONREACTIVE

## 2020-07-13 LAB — C. TRACHOMATIS/N. GONORRHOEAE RNA
C. trachomatis RNA, TMA: NOT DETECTED
N. gonorrhoeae RNA, TMA: NOT DETECTED

## 2020-07-13 MED ORDER — METRONIDAZOLE 0.75 % VA GEL: 1.0000 | Freq: Two times a day (BID) | VAGINAL | 0 refills | Status: DC

## 2020-07-26 ENCOUNTER — Encounter: Payer: Self-pay | Admitting: Family

## 2020-08-04 ENCOUNTER — Encounter: Payer: Self-pay | Admitting: Family

## 2020-08-04 NOTE — Progress Notes (Signed)
History was provided by the patient.  Andrea Fernandez is a 19 y.o. female who is here for pelvic fullness, request for STI screening.   PCP confirmed? No.  No primary care provider on file.  HPI:    -wants STI screening; female partners only; currently no birth control  -had trichomonas in May; not sure if she cleared the infection -pelvic fullness, no constipation  -vaginal discharge -no bleeding, no pain with intercourse  Review of Systems  Constitutional: Negative for chills, fever, malaise/fatigue and weight loss.  Respiratory: Negative for shortness of breath.   Cardiovascular: Negative for chest pain and palpitations.  Gastrointestinal: Negative for abdominal pain and nausea.  Genitourinary: Negative for dysuria and frequency.  Musculoskeletal: Negative for myalgias.  Skin: Negative for rash.  Neurological: Negative for dizziness, tremors and headaches.  Psychiatric/Behavioral: The patient is not nervous/anxious.      Patient Active Problem List   Diagnosis Date Noted  . MDD (major depressive disorder), recurrent episode, moderate (HCC) 03/19/2019  . Sleep disturbance 03/19/2019  . Anorexia 03/19/2019  . GAD (generalized anxiety disorder) 03/19/2019  . Iron deficiency anemia due to chronic blood loss 06/26/2018  . Menorrhagia with regular cycle 06/26/2018    Current Outpatient Medications on File Prior to Visit  Medication Sig Dispense Refill  . ferrous fumarate (HEMOCYTE - 106 MG FE) 325 (106 Fe) MG TABS tablet Take 1 tablet (106 mg of iron total) by mouth daily. 30 tablet 3  . cetirizine (ZYRTEC ALLERGY) 10 MG tablet Take 1 tablet (10 mg total) by mouth daily. (Patient not taking: Reported on 07/12/2020) 30 tablet 0  . naproxen (NAPROSYN) 375 MG tablet Take 1 tablet (375 mg total) by mouth 2 (two) times daily with a meal. (Patient not taking: Reported on 07/12/2020) 30 tablet 0  . pseudoephedrine (SUDAFED) 60 MG tablet Take 1 tablet (60 mg total) by mouth every 8  (eight) hours as needed for congestion. (Patient not taking: Reported on 07/12/2020) 30 tablet 0  . sertraline (ZOLOFT) 50 MG tablet Take 1 tablet (50 mg total) by mouth daily. (Patient not taking: Reported on 07/12/2020) 90 tablet 0   No current facility-administered medications on file prior to visit.    No Known Allergies  Physical Exam:    Vitals:   07/12/20 0951  BP: 118/84  Pulse: 65  Weight: 109 lb (49.4 kg)  Height: 5' 3.78" (1.62 m)    Growth percentile SmartLinks can only be used for patients less than 86 years old. No LMP recorded.  Physical Exam Vitals reviewed.  Constitutional:      Appearance: Normal appearance.  HENT:     Head: Normocephalic.     Mouth/Throat:     Mouth: Mucous membranes are moist.  Eyes:     General: No scleral icterus.    Extraocular Movements: Extraocular movements intact.     Pupils: Pupils are equal, round, and reactive to light.  Cardiovascular:     Rate and Rhythm: Normal rate and regular rhythm.     Heart sounds: No murmur heard.   Pulmonary:     Effort: Pulmonary effort is normal.  Abdominal:     General: Abdomen is flat. There is no distension.  Musculoskeletal:        General: Normal range of motion.     Cervical back: Normal range of motion.  Lymphadenopathy:     Cervical: No cervical adenopathy.  Skin:    General: Skin is warm and dry.  Neurological:     General:  No focal deficit present.     Mental Status: She is alert.  Psychiatric:        Mood and Affect: Mood normal.      Assessment/Plan:  18 yo A/I female presents for vaginal discharge, pelvic fullness and STI screenings; history significant for trichomonas infection in May 2021. No current contraception as she has female partners only; GU exam defered today, however would recommend pelvic if symptoms not resolved pending labs. Return precautions given.   1. Pelvic fullness - WET PREP BY MOLECULAR PROBE - C. trachomatis/N. gonorrhoeae RNA - Urinalysis,  dipstick only  2. Vaginal discharge - WET PREP BY MOLECULAR PROBE - C. trachomatis/N. gonorrhoeae RNA  3. Negative pregnancy test - POCT urine pregnancy  4. Routine screening for STI (sexually transmitted infection) - RPR - HIV Antibody (routine testing w rflx)

## 2020-10-18 ENCOUNTER — Ambulatory Visit (HOSPITAL_COMMUNITY)
Admission: EM | Admit: 2020-10-18 | Discharge: 2020-10-18 | Disposition: A | Discharge status: Home / Self Care | Payer: Medicaid Other | Attending: Internal Medicine | Admitting: Internal Medicine

## 2020-10-18 ENCOUNTER — Other Ambulatory Visit: Payer: Self-pay

## 2020-10-18 ENCOUNTER — Encounter (HOSPITAL_COMMUNITY): Payer: Self-pay

## 2020-10-18 DIAGNOSIS — Z20822 Contact with and (suspected) exposure to covid-19: Secondary | ICD-10-CM | POA: Insufficient documentation

## 2020-10-18 LAB — SARS CORONAVIRUS 2 (TAT 6-24 HRS): SARS Coronavirus 2: NEGATIVE

## 2020-10-18 NOTE — ED Triage Notes (Signed)
Pt presents for COVID testing. Pt denies fever, shortness of breath, cough, or any other symptoms.    

## 2020-11-04 ENCOUNTER — Ambulatory Visit (HOSPITAL_COMMUNITY)
Admission: EM | Admit: 2020-11-04 | Discharge: 2020-11-04 | Disposition: A | Discharge status: Home / Self Care | Payer: Medicaid Other | Attending: Family Medicine | Admitting: Family Medicine

## 2020-11-04 ENCOUNTER — Other Ambulatory Visit: Payer: Self-pay

## 2020-11-04 ENCOUNTER — Encounter (HOSPITAL_COMMUNITY): Payer: Self-pay

## 2020-11-04 DIAGNOSIS — Z20822 Contact with and (suspected) exposure to covid-19: Secondary | ICD-10-CM | POA: Diagnosis not present

## 2020-11-04 DIAGNOSIS — J069 Acute upper respiratory infection, unspecified: Secondary | ICD-10-CM

## 2020-11-04 LAB — SARS CORONAVIRUS 2 (TAT 6-24 HRS): SARS Coronavirus 2: NEGATIVE

## 2020-11-04 MED ORDER — ALBUTEROL SULFATE HFA 108 (90 BASE) MCG/ACT IN AERS: 1.0000 | INHALATION_SPRAY | Freq: Four times a day (QID) | RESPIRATORY_TRACT | 0 refills | Status: DC | PRN

## 2020-11-04 MED ORDER — PROMETHAZINE-DM 6.25-15 MG/5ML PO SYRP: 5.0000 mL | ORAL_SOLUTION | Freq: Four times a day (QID) | ORAL | 0 refills | Status: DC | PRN

## 2020-11-04 NOTE — ED Triage Notes (Signed)
Pt in with c/o productive cough and headache that started yesterday. States she has been exposed to someone who is covid +  Pt has not had medication for sxs

## 2020-11-04 NOTE — ED Provider Notes (Signed)
[facility]    CSN: [id number] Arrival date & time: 11/04/20  1043      History   Chief Complaint Chief Complaint  Patient presents with   Cough   Headache    HPI Andrea Fernandez is a 19 y.o. female.   Patient presenting today with 1 day history of cough and headache. Denies fever, chills, sore throat, CP, SOB, abdominal pain, rash. Not taking anything for sxs. No chronic medical conditions. Recent exposure to someone who was COVID positive.      History reviewed. No pertinent past medical history.  Patient Active Problem List   Diagnosis Date Noted   MDD (major depressive disorder), recurrent episode, moderate (HCC) 03/19/2019   Sleep disturbance 03/19/2019   Anorexia 03/19/2019   GAD (generalized anxiety disorder) 03/19/2019   Iron deficiency anemia due to chronic blood loss 06/26/2018   Menorrhagia with regular cycle 06/26/2018    History reviewed. No pertinent surgical history.  OB History   No obstetric history on file.      Home Medications    Prior to Admission medications   Medication Sig Start Date End Date Taking? Authorizing Provider  albuterol (VENTOLIN HFA) 108 (90 Base) MCG/ACT inhaler Inhale 1-2 puffs into the lungs every 6 (six) hours as needed for wheezing or shortness of breath. 11/04/20   Particia Nearing, PA-C  cetirizine (ZYRTEC ALLERGY) 10 MG tablet Take 1 tablet (10 mg total) by mouth daily. Patient not taking: Reported on 07/12/2020 03/11/20   Wallis Bamberg, PA-C  ferrous fumarate (HEMOCYTE - 106 MG FE) 325 (106 Fe) MG TABS tablet Take 1 tablet (106 mg of iron total) by mouth daily. 03/10/20   Verneda Skill, FNP  metroNIDAZOLE (METROGEL VAGINAL) 0.75 % vaginal gel Place 1 Applicatorful vaginally 2 (two) times daily. 07/13/20   Georges Mouse, NP  naproxen (NAPROSYN) 375 MG tablet Take 1 tablet (375 mg total) by mouth 2 (two) times daily with a meal. Patient not taking: Reported on 07/12/2020 03/11/20   Wallis Bamberg,  PA-C  promethazine-dextromethorphan (PROMETHAZINE-DM) 6.25-15 MG/5ML syrup Take 5 mLs by mouth 4 (four) times daily as needed for cough. 11/04/20   Particia Nearing, PA-C  pseudoephedrine (SUDAFED) 60 MG tablet Take 1 tablet (60 mg total) by mouth every 8 (eight) hours as needed for congestion. Patient not taking: Reported on 07/12/2020 03/11/20   Wallis Bamberg, PA-C  sertraline (ZOLOFT) 50 MG tablet Take 1 tablet (50 mg total) by mouth daily. Patient not taking: Reported on 07/12/2020 03/08/20   Verneda Skill, FNP    Family History Family History  Problem Relation Age of Onset   Depression Mother    Depression Sister     Social History Social History   Tobacco Use   Smoking status: Former Smoker   Smokeless tobacco: Never Used  Building services engineer Use: Never used  Substance Use Topics   Alcohol use: Yes   Drug use: Yes    Types: Marijuana     Allergies   Patient has no known allergies.   Review of Systems Review of Systems PER HPI    Physical Exam Triage Vital Signs ED Triage Vitals  Enc Vitals Group     BP 11/04/20 1159 (!) 171/98     Pulse Rate 11/04/20 1159 73     Resp 11/04/20 1159 18     Temp 11/04/20 1159 98.6 F (37 C)     Temp Source 11/04/20 1159 Oral     SpO2  11/04/20 1159 98 %     Weight --      Height --      Head Circumference --      Peak Flow --      Pain Score 11/04/20 1157 6     Pain Loc --      Pain Edu? --      Excl. in GC? --    No data found.  Updated Vital Signs BP (!) 171/98 (BP Location: Left Arm)    Pulse 73    Temp 98.6 F (37 C) (Oral)    Resp 18    LMP 10/28/2020 (Approximate)    SpO2 98%   Visual Acuity Right Eye Distance:   Left Eye Distance:   Bilateral Distance:    Right Eye Near:   Left Eye Near:    Bilateral Near:     Physical Exam Vitals and nursing note reviewed.  Constitutional:      Appearance: Normal appearance. She is not ill-appearing.  HENT:     Head: Atraumatic.     Right Ear:  Tympanic membrane normal.     Left Ear: Tympanic membrane normal.     Nose: Rhinorrhea present.     Mouth/Throat:     Mouth: Mucous membranes are moist.     Pharynx: Posterior oropharyngeal erythema present. No oropharyngeal exudate.  Eyes:     Extraocular Movements: Extraocular movements intact.     Conjunctiva/sclera: Conjunctivae normal.  Cardiovascular:     Rate and Rhythm: Normal rate and regular rhythm.     Heart sounds: Normal heart sounds.  Pulmonary:     Effort: Pulmonary effort is normal.     Breath sounds: Normal breath sounds. No wheezing or rales.  Abdominal:     General: Bowel sounds are normal. There is no distension.     Palpations: Abdomen is soft.     Tenderness: There is no abdominal tenderness. There is no guarding.  Musculoskeletal:        General: Normal range of motion.     Cervical back: Normal range of motion and neck supple.  Skin:    General: Skin is warm and dry.  Neurological:     Mental Status: She is alert and oriented to person, place, and time.  Psychiatric:        Mood and Affect: Mood normal.        Thought Content: Thought content normal.        Judgment: Judgment normal.      UC Treatments / Results  Labs (all labs ordered are listed, but only abnormal results are displayed) Labs Reviewed  SARS CORONAVIRUS 2 (TAT 6-24 HRS)    EKG   Radiology No results found.  Procedures Procedures (including critical care time)  Medications Ordered in UC Medications - No data to display  Initial Impression / Assessment and Plan / UC Course  I have reviewed the triage vital signs and the nursing notes.  Pertinent labs & imaging results that were available during my care of the patient were reviewed by me and considered in my medical decision making (see chart for details).      COVID pcr pending, note given, discussed isolation and return precautions. Treat with albuterol and phenergan DM for her chest tightness that she's been having.    Final Clinical Impressions(s) / UC Diagnoses   Final diagnoses:  Viral URI with cough   Discharge Instructions   None    ED Prescriptions    Medication Sig  Dispense Auth. Provider   albuterol (VENTOLIN HFA) 108 (90 Base) MCG/ACT inhaler Inhale 1-2 puffs into the lungs every 6 (six) hours as needed for wheezing or shortness of breath. 18 g Roosvelt Maser Amber, [place]   promethazine-dextromethorphan (PROMETHAZINE-DM) 6.25-15 MG/5ML syrup Take 5 mLs by mouth 4 (four) times daily as needed for cough. 100 mL Particia Nearing, [place]     PDMP not reviewed this encounter.   Particia Nearing, [place] 11/04/20 [phone]

## 2020-12-03 ENCOUNTER — Other Ambulatory Visit: Payer: Self-pay

## 2020-12-03 ENCOUNTER — Ambulatory Visit (HOSPITAL_COMMUNITY)
Admission: EM | Admit: 2020-12-03 | Discharge: 2020-12-03 | Disposition: A | Discharge status: Home / Self Care | Payer: Medicaid Other | Attending: Internal Medicine | Admitting: Internal Medicine

## 2020-12-03 DIAGNOSIS — Z01812 Encounter for preprocedural laboratory examination: Secondary | ICD-10-CM | POA: Insufficient documentation

## 2020-12-03 DIAGNOSIS — Z20822 Contact with and (suspected) exposure to covid-19: Secondary | ICD-10-CM | POA: Diagnosis not present

## 2020-12-03 DIAGNOSIS — Z1152 Encounter for screening for COVID-19: Secondary | ICD-10-CM | POA: Diagnosis not present

## 2020-12-03 LAB — SARS CORONAVIRUS 2 (TAT 6-24 HRS): SARS Coronavirus 2: NEGATIVE

## 2020-12-03 NOTE — Discharge Instructions (Signed)
If your Covid-19 test is positive, you will get a phone call from [place] regarding your results. If your Covid-19 test is negative, you will NOT get a phone call from [place] with your results. You may view your results on MyChart. If you do not have a MyChart account, sign up instructions are in your discharge papers. ° °

## 2020-12-03 NOTE — ED Triage Notes (Signed)
Pt presents for covid testing with no known symptoms. 

## 2020-12-26 ENCOUNTER — Other Ambulatory Visit: Payer: Self-pay

## 2020-12-26 ENCOUNTER — Ambulatory Visit (HOSPITAL_COMMUNITY)
Admission: EM | Admit: 2020-12-26 | Discharge: 2020-12-26 | Disposition: A | Discharge status: Home / Self Care | Payer: Medicaid Other | Attending: Student | Admitting: Student

## 2020-12-26 ENCOUNTER — Encounter (HOSPITAL_COMMUNITY): Payer: Self-pay

## 2020-12-26 DIAGNOSIS — L509 Urticaria, unspecified: Secondary | ICD-10-CM | POA: Diagnosis not present

## 2020-12-26 MED ORDER — METHYLPREDNISOLONE SODIUM SUCC 125 MG IJ SOLR
60.0000 mg | Freq: Once | INTRAMUSCULAR | Status: AC
  Administered 2020-12-26: 60 mg via INTRAMUSCULAR

## 2020-12-26 MED ORDER — METHYLPREDNISOLONE SODIUM SUCC 125 MG IJ SOLR
INTRAMUSCULAR | Status: AC
  Filled 2020-12-26: qty 2

## 2020-12-26 MED ORDER — PREDNISONE 20 MG PO TABS: 40.0000 mg | ORAL_TABLET | Freq: Every day | ORAL | 0 refills | Status: AC

## 2020-12-26 NOTE — Discharge Instructions (Addendum)
-  Starting tomorrow, take prednisone (deltasone), 2 pills daily for 5 days. You should take both pills together, ideally in the morning as this can give you energy -Come back and see [place] if your symptoms get worse instead of better; if your lip swelling gets worse or you develop facial swelling; if you develop shortness of breath; etc.

## 2020-12-26 NOTE — ED Triage Notes (Signed)
Pt reports an itchy rash ib the neck x 2 weeks, hands x 1 week and lips itchiness and swelling x 2 days. Denies SOB, difficulty breathing. States no new detergents, lotions, food.

## 2020-12-26 NOTE — ED Provider Notes (Signed)
[facility]    CSN: [id number] Arrival date & time: 12/26/20  1003      History   Chief Complaint Chief Complaint  Patient presents with  . Rash    HPI Andrea Fernandez is a 20 y.o. female Pt reports an itchy rash on the neck x 2 weeks, hands x 1 week and lips itchiness and swelling x 2 days. Denies SOB, difficulty breathing. States no new detergents, lotions, food. Hasn't been spending time outside. using OTC hydrocortisone cream without improvement. Denies any known allergies.   History depression, sleep disturbance, anorexia, GAD, iron deficiency  HPI  History reviewed. No pertinent past medical history.  Patient Active Problem List   Diagnosis Date Noted  . MDD (major depressive disorder), recurrent episode, moderate (HCC) 03/19/2019  . Sleep disturbance 03/19/2019  . Anorexia 03/19/2019  . GAD (generalized anxiety disorder) 03/19/2019  . Iron deficiency anemia due to chronic blood loss 06/26/2018  . Menorrhagia with regular cycle 06/26/2018    History reviewed. No pertinent surgical history.  OB History   No obstetric history on file.      Home Medications    Prior to Admission medications   Medication Sig Start Date End Date Taking? Authorizing Provider  predniSONE (DELTASONE) 20 MG tablet Take 2 tablets (40 mg total) by mouth daily for 5 days. 12/26/20 12/31/20 Yes Rhys Martini, PA-C  albuterol (VENTOLIN HFA) 108 (90 Base) MCG/ACT inhaler Inhale 1-2 puffs into the lungs every 6 (six) hours as needed for wheezing or shortness of breath. 11/04/20   Particia Nearing, PA-C  cetirizine (ZYRTEC ALLERGY) 10 MG tablet Take 1 tablet (10 mg total) by mouth daily. Patient not taking: No sig reported 03/11/20   Wallis Bamberg, PA-C  ferrous fumarate (HEMOCYTE - 106 MG FE) 325 (106 Fe) MG TABS tablet Take 1 tablet (106 mg of iron total) by mouth daily. 03/10/20   Verneda Skill, FNP  metroNIDAZOLE (METROGEL VAGINAL) 0.75 % vaginal gel Place 1 Applicatorful  vaginally 2 (two) times daily. 07/13/20   Georges Mouse, NP  naproxen (NAPROSYN) 375 MG tablet Take 1 tablet (375 mg total) by mouth 2 (two) times daily with a meal. Patient not taking: No sig reported 03/11/20   Wallis Bamberg, PA-C  promethazine-dextromethorphan (PROMETHAZINE-DM) 6.25-15 MG/5ML syrup Take 5 mLs by mouth 4 (four) times daily as needed for cough. 11/04/20   Particia Nearing, PA-C  pseudoephedrine (SUDAFED) 60 MG tablet Take 1 tablet (60 mg total) by mouth every 8 (eight) hours as needed for congestion. Patient not taking: No sig reported 03/11/20   Wallis Bamberg, PA-C  sertraline (ZOLOFT) 50 MG tablet Take 1 tablet (50 mg total) by mouth daily. Patient not taking: No sig reported 03/08/20   Verneda Skill, FNP    Family History Family History  Problem Relation Age of Onset  . Depression Mother   . Depression Sister     Social History Social History   Tobacco Use  . Smoking status: Former Games developer  . Smokeless tobacco: Never Used  Vaping Use  . Vaping Use: Never used  Substance Use Topics  . Alcohol use: Yes  . Drug use: Yes    Types: Marijuana     Allergies   Patient has no known allergies.   Review of Systems Review of Systems  Skin: Positive for rash.  All other systems reviewed and are negative.    Physical Exam Triage Vital Signs ED Triage Vitals  Enc Vitals Group  BP 12/26/20 1027 112/88     Pulse Rate 12/26/20 1027 63     Resp 12/26/20 1027 15     Temp 12/26/20 1027 98.8 F (37.1 C)     Temp Source 12/26/20 1027 Oral     SpO2 12/26/20 1027 100 %     Weight --      Height --      Head Circumference --      Peak Flow --      Pain Score 12/26/20 1025 0     Pain Loc --      Pain Edu? --      Excl. in GC? --    No data found.  Updated Vital Signs BP 112/88 (BP Location: Left Arm)   Pulse 63   Temp 98.8 F (37.1 C) (Oral)   Resp 15   LMP  (Approximate)   SpO2 100%   Visual Acuity Right Eye Distance:   Left Eye  Distance:   Bilateral Distance:    Right Eye Near:   Left Eye Near:    Bilateral Near:     Physical Exam Vitals reviewed.  Constitutional:      Appearance: Normal appearance.  Cardiovascular:     Rate and Rhythm: Normal rate and regular rhythm.     Heart sounds: Normal heart sounds.  Pulmonary:     Effort: Pulmonary effort is normal.     Breath sounds: Normal breath sounds.  Skin:    Comments: Erythematous urticarial papular rash on bilateral hands, arms, neck, face. Upper lip very slightly swollen. No tongue, facial, or pharyngeal swelling.   Neurological:     General: No focal deficit present.     Mental Status: She is alert and oriented to person, place, and time.  Psychiatric:        Mood and Affect: Mood normal.        Behavior: Behavior normal.        Thought Content: Thought content normal.        Judgment: Judgment normal.      UC Treatments / Results  Labs (all labs ordered are listed, but only abnormal results are displayed) Labs Reviewed - No data to display  EKG   Radiology No results found.  Procedures Procedures (including critical care time)  Medications Ordered in UC Medications  methylPREDNISolone sodium succinate (SOLU-MEDROL) 125 mg/2 mL injection 60 mg (has no administration in time range)    Initial Impression / Assessment and Plan / UC Course  I have reviewed the triage vital signs and the nursing notes.  Pertinent labs & imaging results that were available during my care of the patient were reviewed by me and considered in my medical decision making (see chart for details).    This patient is a 20 year old female presenting with 2 weeks of itchy rash and 2 days of upper lip swelling.  On exam upper lip is very mildly swollen, no facial swelling or tongue/pharyngeal swelling.  Lungs are clear to auscultation and she is oxygenating well on room air.  She is not a diabetic. Solumedrol administered today. Starting tomorrow, 40mg  prednisone  x5 days.   Return precautions- shortness of breath, worsening of facial swelling, chest pain, dizziness, symptoms worsen/persist. She verbalizes understanding and agreement.   Spent over 40 minutes obtaining H&P, performing physical, discussing results, treatment plan and plan for follow-up with patient. Patient agrees with plan.   This chart was dictated using voice recognition software, Dragon. Despite the best efforts of this  provider to proofread and correct errors, errors may still occur which can change documentation meaning.  Final Clinical Impressions(s) / UC Diagnoses   Final diagnoses:  Urticarial rash     Discharge Instructions     -Starting tomorrow, take prednisone (deltasone), 2 pills daily for 5 days. You should take both pills together, ideally in the morning as this can give you energy -Come back and see [place] if your symptoms get worse instead of better; if your lip swelling gets worse or you develop facial swelling; if you develop shortness of breath; etc.     ED Prescriptions    Medication Sig Dispense Auth. Provider   predniSONE (DELTASONE) 20 MG tablet Take 2 tablets (40 mg total) by mouth daily for 5 days. 10 tablet Rhys Martini, PA-C     PDMP not reviewed this encounter.   Rhys Martini, PA-C 12/26/20 1106

## 2021-02-23 ENCOUNTER — Ambulatory Visit (HOSPITAL_COMMUNITY)
Admission: EM | Admit: 2021-02-23 | Discharge: 2021-02-23 | Disposition: A | Discharge status: Home / Self Care | Payer: Medicaid Other | Attending: Family Medicine | Admitting: Family Medicine

## 2021-02-23 ENCOUNTER — Other Ambulatory Visit: Payer: Self-pay

## 2021-02-23 ENCOUNTER — Encounter (HOSPITAL_COMMUNITY): Payer: Self-pay

## 2021-02-23 DIAGNOSIS — L309 Dermatitis, unspecified: Secondary | ICD-10-CM | POA: Diagnosis not present

## 2021-02-23 MED ORDER — EUCRISA 2 % EX OINT: 1.0000 "application " | TOPICAL_OINTMENT | Freq: Two times a day (BID) | CUTANEOUS | 0 refills | Status: DC | PRN

## 2021-02-23 MED ORDER — TRIAMCINOLONE ACETONIDE 0.1 % EX CREA: 1.0000 "application " | TOPICAL_CREAM | Freq: Two times a day (BID) | CUTANEOUS | 1 refills | Status: DC

## 2021-02-23 NOTE — ED Triage Notes (Signed)
Pt in with c/o neck and eyelid rash that has been going on for over 1 week.  Pt states the area is very itchy and it started with her neck and spread to her eyelids  Pt has been using cortisone cream with no relief

## 2021-02-23 NOTE — Discharge Instructions (Addendum)
Only use unscented, hypoallergenic soaps, detergents, make-up products.  Moisturize your skin twice daily with a thick cream such as CeraVe.  Use the triamcinolone cream as needed for itchy rashes on your body other than face and private areas.  May use hydrocortisone over-the-counter for the face and private areas or the [place] that I am also sending over today.  If the Pam Drown is not covered by your insurance, you can try going on [email or web address] and trying to download a savings card to help with the cost of the medication.

## 2021-02-23 NOTE — ED Provider Notes (Signed)
[facility]    CSN: [id number] Arrival date & time: 02/23/21  1132      History   Chief Complaint Chief Complaint  Patient presents with  . Rash    HPI Andrea Fernandez is a 20 y.o. female.   Patient presenting today with acute on chronic itchy dry rash on neck and now also bilateral eyelids.  Has been treated with prednisone in the past with temporary relief of rash.  Trying over-the-counter cortisone at this time with mild temporary relief.  Denies fever, chills, new hygiene products or outdoor exposures.  Does have a history of allergic rhinitis not on antihistamines.     History reviewed. No pertinent past medical history.  Patient Active Problem List   Diagnosis Date Noted  . MDD (major depressive disorder), recurrent episode, moderate (HCC) 03/19/2019  . Sleep disturbance 03/19/2019  . Anorexia 03/19/2019  . GAD (generalized anxiety disorder) 03/19/2019  . Iron deficiency anemia due to chronic blood loss 06/26/2018  . Menorrhagia with regular cycle 06/26/2018    History reviewed. No pertinent surgical history.  OB History   No obstetric history on file.      Home Medications    Prior to Admission medications   Medication Sig Start Date End Date Taking? Authorizing Provider  Crisaborole (EUCRISA) 2 % OINT Apply 1 application topically 2 (two) times daily as needed. 02/23/21  Yes Particia Nearing, PA-C  triamcinolone cream (KENALOG) 0.1 % Apply 1 application topically 2 (two) times daily. 02/23/21  Yes Particia Nearing, PA-C  albuterol (VENTOLIN HFA) 108 (90 Base) MCG/ACT inhaler Inhale 1-2 puffs into the lungs every 6 (six) hours as needed for wheezing or shortness of breath. 11/04/20   Particia Nearing, PA-C  cetirizine (ZYRTEC ALLERGY) 10 MG tablet Take 1 tablet (10 mg total) by mouth daily. Patient not taking: No sig reported 03/11/20   Wallis Bamberg, PA-C  ferrous fumarate (HEMOCYTE - 106 MG FE) 325 (106 Fe) MG TABS tablet Take 1  tablet (106 mg of iron total) by mouth daily. 03/10/20   Verneda Skill, FNP  metroNIDAZOLE (METROGEL VAGINAL) 0.75 % vaginal gel Place 1 Applicatorful vaginally 2 (two) times daily. 07/13/20   Georges Mouse, NP  naproxen (NAPROSYN) 375 MG tablet Take 1 tablet (375 mg total) by mouth 2 (two) times daily with a meal. Patient not taking: No sig reported 03/11/20   Wallis Bamberg, PA-C  promethazine-dextromethorphan (PROMETHAZINE-DM) 6.25-15 MG/5ML syrup Take 5 mLs by mouth 4 (four) times daily as needed for cough. 11/04/20   Particia Nearing, PA-C  pseudoephedrine (SUDAFED) 60 MG tablet Take 1 tablet (60 mg total) by mouth every 8 (eight) hours as needed for congestion. Patient not taking: No sig reported 03/11/20   Wallis Bamberg, PA-C  sertraline (ZOLOFT) 50 MG tablet Take 1 tablet (50 mg total) by mouth daily. Patient not taking: No sig reported 03/08/20   Verneda Skill, FNP    Family History Family History  Problem Relation Age of Onset  . Depression Mother   . Depression Sister     Social History Social History   Tobacco Use  . Smoking status: Former Games developer  . Smokeless tobacco: Never Used  Vaping Use  . Vaping Use: Never used  Substance Use Topics  . Alcohol use: Yes  . Drug use: Yes    Types: Marijuana     Allergies   Patient has no known allergies.   Review of Systems Review of Systems Per HPI Physical  Exam Triage Vital Signs ED Triage Vitals  Enc Vitals Group     BP 02/23/21 1231 123/81     Pulse Rate 02/23/21 1231 65     Resp 02/23/21 1231 16     Temp 02/23/21 1231 98.2 F (36.8 C)     Temp src --      SpO2 02/23/21 1231 97 %     Weight --      Height --      Head Circumference --      Peak Flow --      Pain Score 02/23/21 1228 0     Pain Loc --      Pain Edu? --      Excl. in GC? --    No data found.  Updated Vital Signs BP 123/81   Pulse 65   Temp 98.2 F (36.8 C)   Resp 16   LMP 02/18/2021 (Exact Date)   SpO2 97%   Visual  Acuity Right Eye Distance:   Left Eye Distance:   Bilateral Distance:    Right Eye Near:   Left Eye Near:    Bilateral Near:     Physical Exam Vitals and nursing note reviewed.  Constitutional:      Appearance: Normal appearance. She is not ill-appearing.  HENT:     Head: Atraumatic.  Eyes:     Extraocular Movements: Extraocular movements intact.     Conjunctiva/sclera: Conjunctivae normal.  Cardiovascular:     Rate and Rhythm: Normal rate and regular rhythm.     Heart sounds: Normal heart sounds.  Pulmonary:     Effort: Pulmonary effort is normal.     Breath sounds: Normal breath sounds.  Musculoskeletal:        General: Normal range of motion.     Cervical back: Normal range of motion and neck supple.  Skin:    General: Skin is warm and dry.     Findings: Rash present.     Comments: Dry, hyperpigmented rash bilateral neck and eyelids  Neurological:     Mental Status: She is alert and oriented to person, place, and time.  Psychiatric:        Mood and Affect: Mood normal.        Thought Content: Thought content normal.        Judgment: Judgment normal.      UC Treatments / Results  Labs (all labs ordered are listed, but only abnormal results are displayed) Labs Reviewed - No data to display  EKG   Radiology No results found.  Procedures Procedures (including critical care time)  Medications Ordered in UC Medications - No data to display  Initial Impression / Assessment and Plan / UC Course  I have reviewed the triage vital signs and the nursing notes.  Pertinent labs & imaging results that were available during my care of the patient were reviewed by me and considered in my medical decision making (see chart for details).     Discussed daily skin care with eczema, triamcinolone for everywhere other than face and private areas and Eucrisa for her eyelid rash.  Follow-up with primary care or dermatology for recheck  Final Clinical Impressions(s) / UC  Diagnoses   Final diagnoses:  Eczema, unspecified type     Discharge Instructions     Only use unscented, hypoallergenic soaps, detergents, make-up products.  Moisturize your skin twice daily with a thick cream such as CeraVe.  Use the triamcinolone cream as needed for itchy rashes  on your body other than face and private areas.  May use hydrocortisone over-the-counter for the face and private areas or the [place] that I am also sending over today.  If the Pam Drown is not covered by your insurance, you can try going on [email or web address] and trying to download a savings card to help with the cost of the medication.    ED Prescriptions    Medication Sig Dispense Auth. Provider   triamcinolone cream (KENALOG) 0.1 % Apply 1 application topically 2 (two) times daily. 100 g Roosvelt Maser [place], PA-C   Crisaborole (EUCRISA) 2 % OINT Apply 1 application topically 2 (two) times daily as needed. 100 g Particia Nearing, [place]     PDMP not reviewed this encounter.   Particia Nearing, [place] 02/23/21 1323

## 2021-03-07 ENCOUNTER — Ambulatory Visit (HOSPITAL_COMMUNITY)
Admission: EM | Admit: 2021-03-07 | Discharge: 2021-03-07 | Disposition: A | Discharge status: Home / Self Care | Payer: Medicaid Other | Attending: Urgent Care | Admitting: Urgent Care

## 2021-03-07 ENCOUNTER — Encounter (HOSPITAL_COMMUNITY): Payer: Self-pay

## 2021-03-07 DIAGNOSIS — J069 Acute upper respiratory infection, unspecified: Secondary | ICD-10-CM

## 2021-03-07 DIAGNOSIS — R0981 Nasal congestion: Secondary | ICD-10-CM

## 2021-03-07 DIAGNOSIS — J029 Acute pharyngitis, unspecified: Secondary | ICD-10-CM

## 2021-03-07 MED ORDER — BENZONATATE 100 MG PO CAPS: 100.0000 mg | ORAL_CAPSULE | Freq: Three times a day (TID) | ORAL | 0 refills | Status: DC | PRN

## 2021-03-07 MED ORDER — ALBUTEROL SULFATE HFA 108 (90 BASE) MCG/ACT IN AERS: 1.0000 | INHALATION_SPRAY | Freq: Four times a day (QID) | RESPIRATORY_TRACT | 0 refills | Status: DC | PRN

## 2021-03-07 MED ORDER — PSEUDOEPHEDRINE HCL 60 MG PO TABS: 60.0000 mg | ORAL_TABLET | Freq: Three times a day (TID) | ORAL | 0 refills | Status: DC | PRN

## 2021-03-07 MED ORDER — PROMETHAZINE-DM 6.25-15 MG/5ML PO SYRP: 5.0000 mL | ORAL_SOLUTION | Freq: Every evening | ORAL | 0 refills | Status: DC | PRN

## 2021-03-07 MED ORDER — CETIRIZINE HCL 10 MG PO TABS: 10.0000 mg | ORAL_TABLET | Freq: Every day | ORAL | 0 refills | Status: DC

## 2021-03-07 MED ORDER — FLUTICASONE PROPIONATE 50 MCG/ACT NA SUSP: 2.0000 | Freq: Every day | NASAL | 0 refills | Status: DC

## 2021-03-07 NOTE — ED Triage Notes (Signed)
Pt in with c/o productive cough and St that has been going on for a few days  Pt has been taking Nyquil and cough drops for sx  Pt states when she coughs her chest hurts

## 2021-03-07 NOTE — ED Provider Notes (Signed)
[facility]   MRN: [id number] DOB: 10/14/2001  Subjective:   Andrea Fernandez is a 20 y.o. female presenting for 3-day history of acute onset runny and stuffy nose, chills, malaise and fatigue, dry cough that elicits mild chest pain, bilateral ear pain and throat pain.  Patient states that she has previously had to use an inhaler but does not have an active one.  Denies being a smoker.  Has never been worked up or diagnosed with asthma but thinks she may have this.  Patient does not care for COVID-19 test.  No current facility-administered medications for this encounter.  Current Outpatient Medications:  .  albuterol (VENTOLIN HFA) 108 (90 Base) MCG/ACT inhaler, Inhale 1-2 puffs into the lungs every 6 (six) hours as needed for wheezing or shortness of breath., Disp: 18 g, Rfl: 0 .  cetirizine (ZYRTEC ALLERGY) 10 MG tablet, Take 1 tablet (10 mg total) by mouth daily. (Patient not taking: No sig reported), Disp: 30 tablet, Rfl: 0 .  Crisaborole (EUCRISA) 2 % OINT, Apply 1 application topically 2 (two) times daily as needed., Disp: 100 g, Rfl: 0 .  ferrous fumarate (HEMOCYTE - 106 MG FE) 325 (106 Fe) MG TABS tablet, Take 1 tablet (106 mg of iron total) by mouth daily., Disp: 30 tablet, Rfl: 3 .  metroNIDAZOLE (METROGEL VAGINAL) 0.75 % vaginal gel, Place 1 Applicatorful vaginally 2 (two) times daily., Disp: 70 g, Rfl: 0 .  naproxen (NAPROSYN) 375 MG tablet, Take 1 tablet (375 mg total) by mouth 2 (two) times daily with a meal. (Patient not taking: No sig reported), Disp: 30 tablet, Rfl: 0 .  promethazine-dextromethorphan (PROMETHAZINE-DM) 6.25-15 MG/5ML syrup, Take 5 mLs by mouth 4 (four) times daily as needed for cough., Disp: 100 mL, Rfl: 0 .  pseudoephedrine (SUDAFED) 60 MG tablet, Take 1 tablet (60 mg total) by mouth every 8 (eight) hours as needed for congestion. (Patient not taking: No sig reported), Disp: 30 tablet, Rfl: 0 .  sertraline (ZOLOFT) 50 MG tablet, Take 1 tablet (50  mg total) by mouth daily. (Patient not taking: No sig reported), Disp: 90 tablet, Rfl: 0 .  triamcinolone cream (KENALOG) 0.1 %, Apply 1 application topically 2 (two) times daily., Disp: 100 g, Rfl: 1   No Known Allergies  History reviewed. No pertinent past medical history.   History reviewed. No pertinent surgical history.  Family History  Problem Relation Age of Onset  . Depression Mother   . Depression Sister     Social History   Tobacco Use  . Smoking status: Former Games developer  . Smokeless tobacco: Never Used  Vaping Use  . Vaping Use: Never used  Substance Use Topics  . Alcohol use: Yes  . Drug use: Yes    Types: Marijuana    ROS   Objective:   Vitals: BP 127/85   Pulse 78   Temp 98.5 F (36.9 C)   Resp 16   LMP 02/18/2021 (Exact Date)   SpO2 97%   Physical Exam Constitutional:      General: She is not in acute distress.    Appearance: Normal appearance. She is well-developed. She is not ill-appearing, toxic-appearing or diaphoretic.  HENT:     Head: Normocephalic and atraumatic.     Right Ear: Tympanic membrane, ear canal and external ear normal. No drainage or tenderness. No middle ear effusion. There is no impacted cerumen. Tympanic membrane is not erythematous.     Left Ear: Tympanic membrane, ear canal and  external ear normal. No drainage or tenderness.  No middle ear effusion. There is no impacted cerumen. Tympanic membrane is not erythematous.     Nose: Congestion and rhinorrhea present.     Mouth/Throat:     Mouth: Mucous membranes are moist. No oral lesions.     Pharynx: No pharyngeal swelling, oropharyngeal exudate, posterior oropharyngeal erythema or uvula swelling.     Tonsils: No tonsillar exudate or tonsillar abscesses. 0 on the right. 0 on the left.     Comments: Post-nasal drainage overlying pharynx.  Eyes:     Extraocular Movements: Extraocular movements intact.     Right eye: Normal extraocular motion.     Left eye: Normal extraocular  motion.     Conjunctiva/sclera: Conjunctivae normal.     Pupils: Pupils are equal, round, and reactive to light.  Cardiovascular:     Rate and Rhythm: Normal rate and regular rhythm.     Pulses: Normal pulses.     Heart sounds: Normal heart sounds. No murmur heard. No friction rub. No gallop.   Pulmonary:     Effort: Pulmonary effort is normal. No respiratory distress.     Breath sounds: Normal breath sounds. No stridor. No wheezing, rhonchi or rales.  Musculoskeletal:     Cervical back: Normal range of motion and neck supple.  Lymphadenopathy:     Cervical: No cervical adenopathy.  Skin:    General: Skin is warm and dry.     Findings: No rash.  Neurological:     General: No focal deficit present.     Mental Status: She is alert and oriented to person, place, and time.  Psychiatric:        Mood and Affect: Mood normal.        Behavior: Behavior normal.        Thought Content: Thought content normal.        Judgment: Judgment normal.     Assessment and Plan :   PDMP not reviewed this encounter.  1. Viral URI with cough   2. Nasal congestion   3. Sore throat     At patient request, defer COVID-19 testing.  Suspect viral URI, viral syndrome; physical exam findings reassuring and vital signs stable for discharge. Advised supportive care, offered symptomatic relief. Counseled patient on potential for adverse effects with medications prescribed/recommended today, ER and return-to-clinic precautions discussed, patient verbalized understanding.     Wallis Bamberg, PA-C 03/07/21 1020

## 2021-05-06 IMAGING — DX CHEST - 2 VIEW
2 series · 2 of 2 positions shown · non-contrast
Comparison: 03/09/2016

CLINICAL DATA: Two day history of chest pain.

EXAM:
CHEST - 2 VIEW

[chest pa]
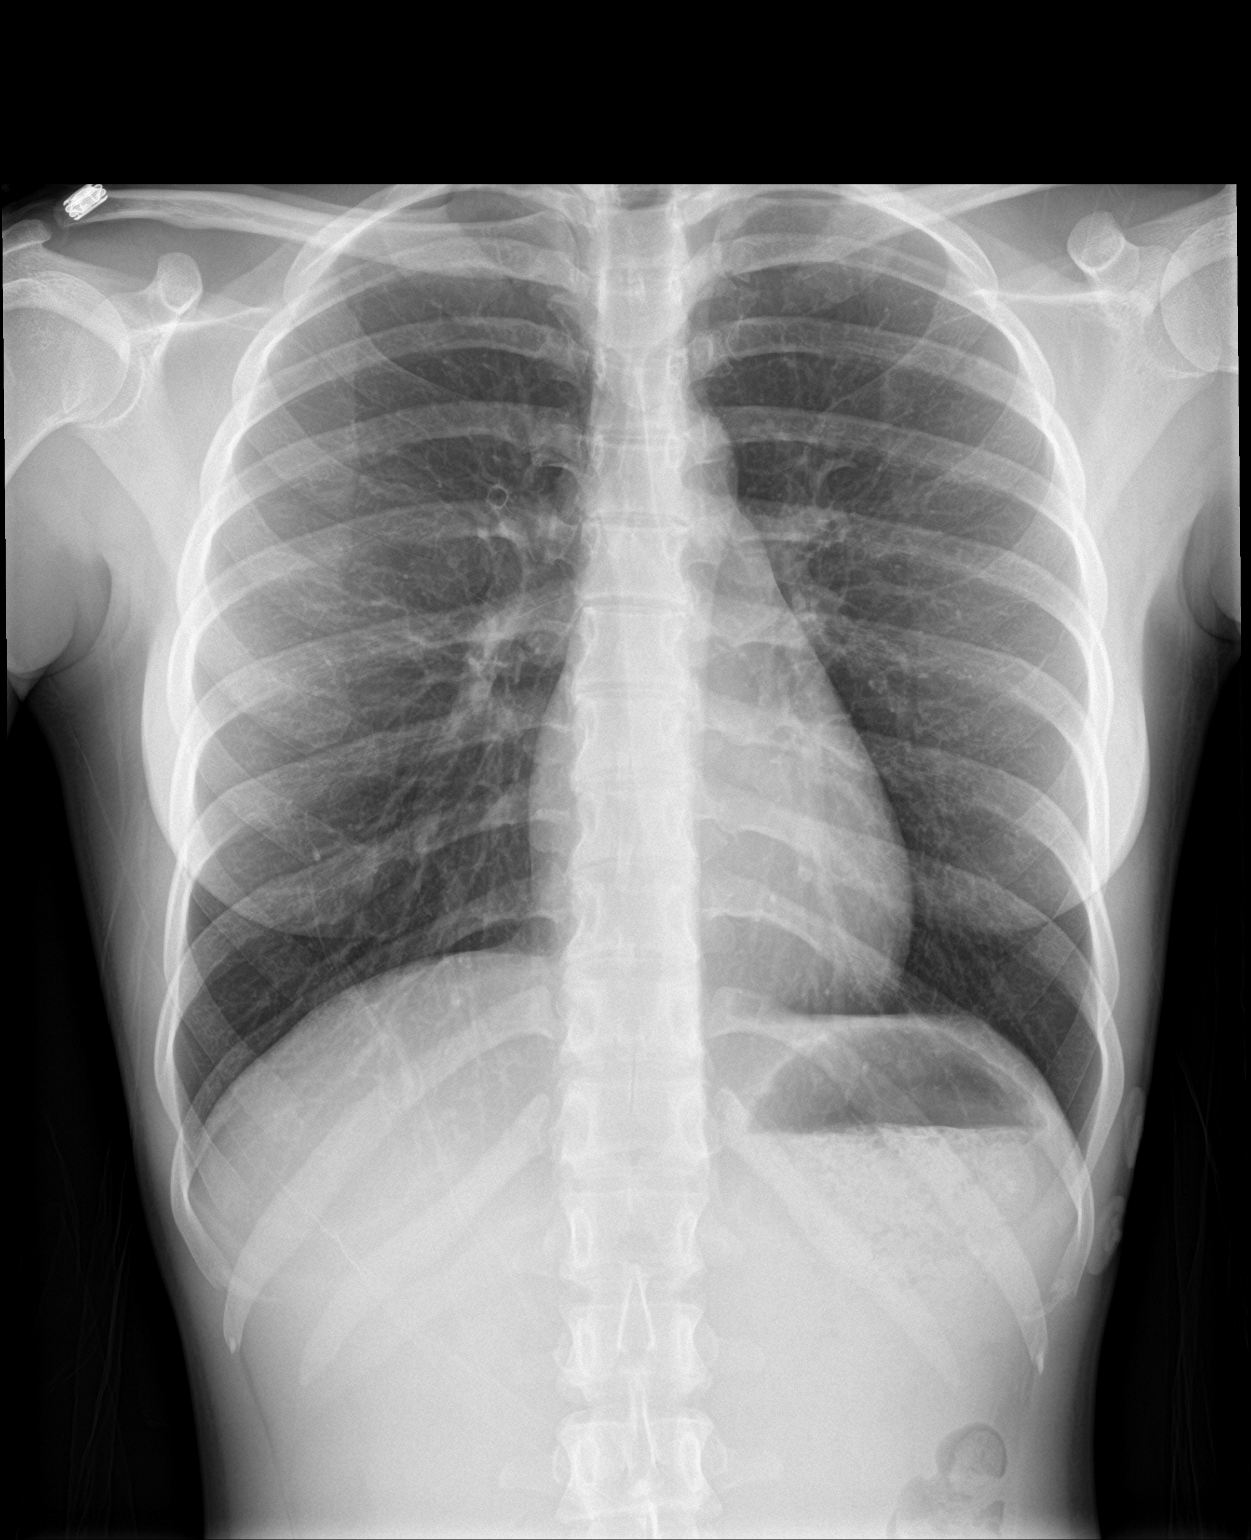

[chest lat]
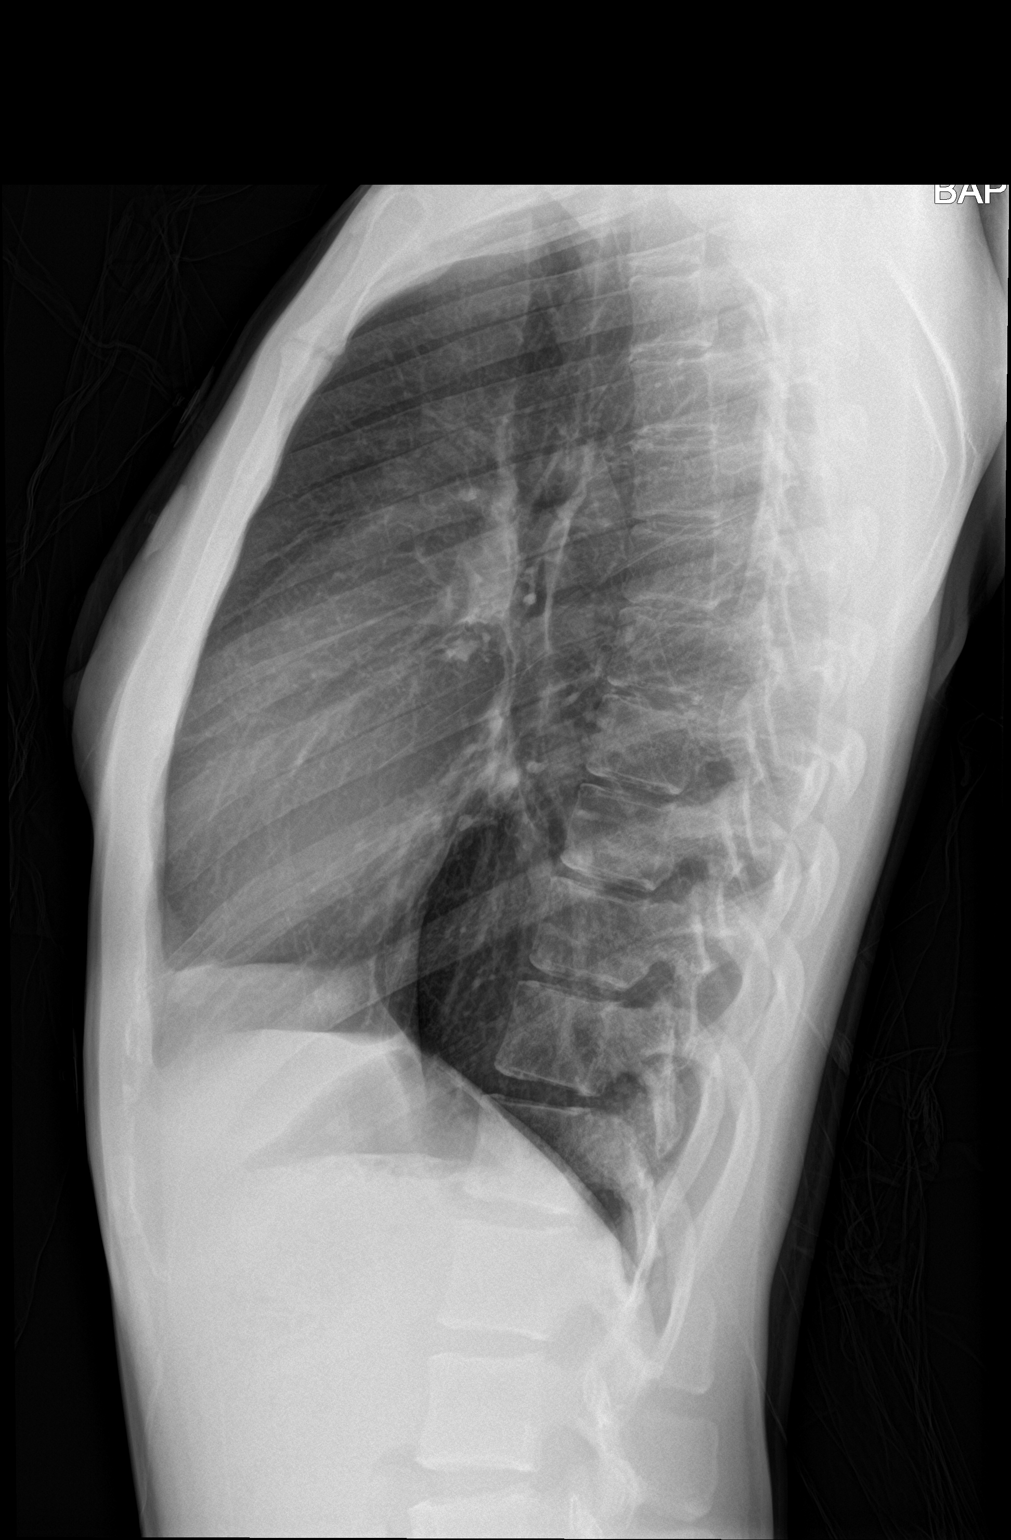

[2 of 2 positions shown; findings below may reference images not displayed]

FINDINGS: The heart size and mediastinal contours are within normal limits.
Both lungs are clear. The visualized skeletal structures are
unremarkable.
IMPRESSION: Normal chest x-ray.

## 2021-05-11 ENCOUNTER — Encounter: Payer: Self-pay | Admitting: Family

## 2021-06-26 ENCOUNTER — Ambulatory Visit (HOSPITAL_COMMUNITY)
Admission: EM | Admit: 2021-06-26 | Discharge: 2021-06-26 | Disposition: A | Discharge status: Home / Self Care | Payer: Medicaid Other | Attending: Family Medicine | Admitting: Family Medicine

## 2021-06-26 ENCOUNTER — Other Ambulatory Visit: Payer: Self-pay

## 2021-06-26 ENCOUNTER — Encounter (HOSPITAL_COMMUNITY): Payer: Self-pay

## 2021-06-26 DIAGNOSIS — J069 Acute upper respiratory infection, unspecified: Secondary | ICD-10-CM

## 2021-06-26 DIAGNOSIS — Z20822 Contact with and (suspected) exposure to covid-19: Secondary | ICD-10-CM | POA: Diagnosis not present

## 2021-06-26 MED ORDER — BENZONATATE 100 MG PO CAPS: 100.0000 mg | ORAL_CAPSULE | Freq: Three times a day (TID) | ORAL | 0 refills | Status: DC | PRN

## 2021-06-26 MED ORDER — PROMETHAZINE-DM 6.25-15 MG/5ML PO SYRP: 5.0000 mL | ORAL_SOLUTION | Freq: Every evening | ORAL | 0 refills | Status: DC | PRN

## 2021-06-26 NOTE — ED Provider Notes (Signed)
[facility]    CSN: [id number] Arrival date & time: 06/26/21  1002      History   Chief Complaint Chief Complaint  Patient presents with   Cough   Sore Throat   Otalgia    HPI Andrea Fernandez is a 20 y.o. female.   Patient presenting today with 3-day history of cough, sore throat, right ear pain and pressure, fatigue.  Denies known fever, chills, body aches, chest pain, shortness of breath, abdominal pain, nausea vomiting or diarrhea.  So far using throat lozenges, Mucinex, cough syrups with minimal relief.  No known sick contacts recently.  No known chronic medical problems that are pertinent.   History reviewed. No pertinent past medical history.  Patient Active Problem List   Diagnosis Date Noted   MDD (major depressive disorder), recurrent episode, moderate (HCC) 03/19/2019   Sleep disturbance 03/19/2019   Anorexia 03/19/2019   GAD (generalized anxiety disorder) 03/19/2019   Iron deficiency anemia due to chronic blood loss 06/26/2018   Menorrhagia with regular cycle 06/26/2018    History reviewed. No pertinent surgical history.  OB History   No obstetric history on file.      Home Medications    Prior to Admission medications   Medication Sig Start Date End Date Taking? Authorizing Provider  albuterol (VENTOLIN HFA) 108 (90 Base) MCG/ACT inhaler Inhale 1-2 puffs into the lungs every 6 (six) hours as needed for wheezing or shortness of breath. 03/07/21   Wallis Bamberg, PA-C  benzonatate (TESSALON) 100 MG capsule Take 1-2 capsules (100-200 mg total) by mouth 3 (three) times daily as needed for cough. 06/26/21   Particia Nearing, PA-C  cetirizine (ZYRTEC ALLERGY) 10 MG tablet Take 1 tablet (10 mg total) by mouth daily. 03/07/21   Wallis Bamberg, PA-C  Crisaborole (EUCRISA) 2 % OINT Apply 1 application topically 2 (two) times daily as needed. 02/23/21   Particia Nearing, PA-C  ferrous fumarate (HEMOCYTE - 106 MG FE) 325 (106 Fe) MG TABS tablet Take 1  tablet (106 mg of iron total) by mouth daily. 03/10/20   Verneda Skill, FNP  fluticasone (FLONASE) 50 MCG/ACT nasal spray Place 2 sprays into both nostrils daily. 03/07/21   Wallis Bamberg, PA-C  metroNIDAZOLE (METROGEL VAGINAL) 0.75 % vaginal gel Place 1 Applicatorful vaginally 2 (two) times daily. 07/13/20   Georges Mouse, NP  naproxen (NAPROSYN) 375 MG tablet Take 1 tablet (375 mg total) by mouth 2 (two) times daily with a meal. Patient not taking: No sig reported 03/11/20   Wallis Bamberg, PA-C  promethazine-dextromethorphan (PROMETHAZINE-DM) 6.25-15 MG/5ML syrup Take 5 mLs by mouth at bedtime as needed for cough. 06/26/21   Particia Nearing, PA-C  pseudoephedrine (SUDAFED) 60 MG tablet Take 1 tablet (60 mg total) by mouth every 8 (eight) hours as needed for congestion. 03/07/21   Wallis Bamberg, PA-C  sertraline (ZOLOFT) 50 MG tablet Take 1 tablet (50 mg total) by mouth daily. Patient not taking: No sig reported 03/08/20   Alfonso Ramus T, FNP  triamcinolone cream (KENALOG) 0.1 % Apply 1 application topically 2 (two) times daily. 02/23/21   Particia Nearing, PA-C    Family History Family History  Problem Relation Age of Onset   Depression Mother    Depression Sister     Social History Social History   Tobacco Use   Smoking status: Former   Smokeless tobacco: Never  Building services engineer Use: Never used  Substance Use Topics   Alcohol  use: Yes   Drug use: Yes    Types: Marijuana     Allergies   Patient has no known allergies.   Review of Systems Review of Systems Per HPI  Physical Exam Triage Vital Signs ED Triage Vitals  Enc Vitals Group     BP 06/26/21 1020 118/79     Pulse Rate 06/26/21 1020 77     Resp 06/26/21 1020 20     Temp 06/26/21 1020 98.3 F (36.8 C)     Temp Source 06/26/21 1020 Oral     SpO2 06/26/21 1020 99 %     Weight --      Height --      Head Circumference --      Peak Flow --      Pain Score 06/26/21 1019 7     Pain Loc --       Pain Edu? --      Excl. in GC? --    No data found.  Updated Vital Signs BP 118/79 (BP Location: Left Arm)   Pulse 77   Temp 98.3 F (36.8 C) (Oral)   Resp 20   LMP 05/23/2021 (Exact Date)   SpO2 99%   Visual Acuity Right Eye Distance:   Left Eye Distance:   Bilateral Distance:    Right Eye Near:   Left Eye Near:    Bilateral Near:     Physical Exam Vitals and nursing note reviewed.  Constitutional:      Appearance: Normal appearance. She is not ill-appearing.  HENT:     Head: Atraumatic.     Right Ear: Tympanic membrane normal.     Left Ear: Tympanic membrane normal.     Nose: Rhinorrhea present.     Mouth/Throat:     Mouth: Mucous membranes are moist.     Pharynx: Posterior oropharyngeal erythema present. No oropharyngeal exudate.  Eyes:     Extraocular Movements: Extraocular movements intact.     Conjunctiva/sclera: Conjunctivae normal.  Cardiovascular:     Rate and Rhythm: Normal rate and regular rhythm.     Heart sounds: Normal heart sounds.  Pulmonary:     Effort: Pulmonary effort is normal.     Breath sounds: Normal breath sounds. No wheezing or rales.  Abdominal:     General: Bowel sounds are normal. There is no distension.     Palpations: Abdomen is soft.     Tenderness: There is no abdominal tenderness. There is no right CVA tenderness, left CVA tenderness or guarding.  Musculoskeletal:        General: Normal range of motion.     Cervical back: Normal range of motion and neck supple.  Skin:    General: Skin is warm and dry.  Neurological:     Mental Status: She is alert and oriented to person, place, and time.  Psychiatric:        Mood and Affect: Mood normal.        Thought Content: Thought content normal.        Judgment: Judgment normal.     UC Treatments / Results  Labs (all labs ordered are listed, but only abnormal results are displayed) Labs Reviewed  SARS CORONAVIRUS 2 (TAT 6-24 HRS)    EKG   Radiology No results  found.  Procedures Procedures (including critical care time)  Medications Ordered in UC Medications - No data to display  Initial Impression / Assessment and Plan / UC Course  I have reviewed the triage vital  signs and the nursing notes.  Pertinent labs & imaging results that were available during my care of the patient were reviewed by me and considered in my medical decision making (see chart for details).     Suspect viral illness, COVID PCR pending.  Work note given with protocol for quarantine.  We will send Tessalon, Phenergan DM for symptomatic benefit and discussed over-the-counter symptomatic medications and supportive home care.  Follow-up for worsening symptoms.  Final Clinical Impressions(s) / UC Diagnoses   Final diagnoses:  Viral URI with cough   Discharge Instructions   None    ED Prescriptions     Medication Sig Dispense Auth. Provider   benzonatate (TESSALON) 100 MG capsule Take 1-2 capsules (100-200 mg total) by mouth 3 (three) times daily as needed for cough. 30 capsule Particia Nearing, [place]   promethazine-dextromethorphan (PROMETHAZINE-DM) 6.25-15 MG/5ML syrup Take 5 mLs by mouth at bedtime as needed for cough. 100 mL Particia Nearing, [place]      PDMP not reviewed this encounter.   Particia Nearing, [place] 06/26/21 1042

## 2021-06-26 NOTE — ED Triage Notes (Signed)
Pt presents with c/o cough, sore throat and ear pain X 3 days.   States she has taken cough medicine, Mucinex, cough drops. States it has not helped her feel better.

## 2021-06-27 LAB — SARS CORONAVIRUS 2 (TAT 6-24 HRS): SARS Coronavirus 2: NEGATIVE

## 2021-08-26 ENCOUNTER — Encounter (HOSPITAL_COMMUNITY): Payer: Self-pay

## 2021-08-26 ENCOUNTER — Ambulatory Visit (HOSPITAL_COMMUNITY)
Admission: EM | Admit: 2021-08-26 | Discharge: 2021-08-26 | Disposition: A | Discharge status: Home / Self Care | Payer: Medicaid Other | Attending: Emergency Medicine | Admitting: Emergency Medicine

## 2021-08-26 DIAGNOSIS — N12 Tubulo-interstitial nephritis, not specified as acute or chronic: Secondary | ICD-10-CM | POA: Insufficient documentation

## 2021-08-26 DIAGNOSIS — R109 Unspecified abdominal pain: Secondary | ICD-10-CM | POA: Insufficient documentation

## 2021-08-26 LAB — POCT URINALYSIS DIPSTICK, ED / UC
Bilirubin Urine: NEGATIVE
Glucose, UA: NEGATIVE mg/dL
Hgb urine dipstick: NEGATIVE
Ketones, ur: NEGATIVE mg/dL
Leukocytes,Ua: NEGATIVE
Nitrite: POSITIVE — AB
Protein, ur: NEGATIVE mg/dL
Specific Gravity, Urine: 1.025 (ref 1.005–1.030)
Urobilinogen, UA: 2 mg/dL — ABNORMAL HIGH (ref 0.0–1.0)
pH: 7 (ref 5.0–8.0)

## 2021-08-26 LAB — POC URINE PREG, ED: Preg Test, Ur: NEGATIVE

## 2021-08-26 MED ORDER — SULFAMETHOXAZOLE-TRIMETHOPRIM 800-160 MG PO TABS: 1.0000 | ORAL_TABLET | Freq: Two times a day (BID) | ORAL | 0 refills | Status: AC

## 2021-08-26 NOTE — ED Triage Notes (Signed)
Pt reports right flank pain x 3 days

## 2021-08-26 NOTE — ED Provider Notes (Signed)
[facility]    CSN: [id number] Arrival date & time: 08/26/21  1332      History   Chief Complaint Chief Complaint  Patient presents with   Flank Pain    HPI Andrea Fernandez is a 20 y.o. female.   Patient here for evaluation of right-sided flank pain that has been ongoing for the past 3 days.  Reports pain is worse when lifting something heavy and movement.  Denies any dysuria, urgency, or frequency.  Has not taken any OTC medications or treatments.  Denies any trauma, injury, or other precipitating event.  Denies any specific alleviating or aggravating factors.  Denies any fevers, chest pain, shortness of breath, N/V/D, constipation, numbness, tingling, weakness, abdominal pain, or headaches.    The history is provided by the patient.  Flank Pain   History reviewed. No pertinent past medical history.  Patient Active Problem List   Diagnosis Date Noted   MDD (major depressive disorder), recurrent episode, moderate (HCC) 03/19/2019   Sleep disturbance 03/19/2019   Anorexia 03/19/2019   GAD (generalized anxiety disorder) 03/19/2019   Iron deficiency anemia due to chronic blood loss 06/26/2018   Menorrhagia with regular cycle 06/26/2018    History reviewed. No pertinent surgical history.  OB History   No obstetric history on file.      Home Medications    Prior to Admission medications   Medication Sig Start Date End Date Taking? Authorizing Provider  sulfamethoxazole-trimethoprim (BACTRIM DS) 800-160 MG tablet Take 1 tablet by mouth 2 (two) times daily for 10 days. 08/26/21 09/05/21 Yes Ivette Loyal, NP  albuterol (VENTOLIN HFA) 108 (90 Base) MCG/ACT inhaler Inhale 1-2 puffs into the lungs every 6 (six) hours as needed for wheezing or shortness of breath. 03/07/21   Wallis Bamberg, PA-C  benzonatate (TESSALON) 100 MG capsule Take 1-2 capsules (100-200 mg total) by mouth 3 (three) times daily as needed for cough. 06/26/21   Particia Nearing, PA-C   cetirizine (ZYRTEC ALLERGY) 10 MG tablet Take 1 tablet (10 mg total) by mouth daily. 03/07/21   Wallis Bamberg, PA-C  Crisaborole (EUCRISA) 2 % OINT Apply 1 application topically 2 (two) times daily as needed. 02/23/21   Particia Nearing, PA-C  ferrous fumarate (HEMOCYTE - 106 MG FE) 325 (106 Fe) MG TABS tablet Take 1 tablet (106 mg of iron total) by mouth daily. 03/10/20   Verneda Skill, FNP  fluticasone (FLONASE) 50 MCG/ACT nasal spray Place 2 sprays into both nostrils daily. 03/07/21   Wallis Bamberg, PA-C  metroNIDAZOLE (METROGEL VAGINAL) 0.75 % vaginal gel Place 1 Applicatorful vaginally 2 (two) times daily. 07/13/20   Georges Mouse, NP  naproxen (NAPROSYN) 375 MG tablet Take 1 tablet (375 mg total) by mouth 2 (two) times daily with a meal. Patient not taking: No sig reported 03/11/20   Wallis Bamberg, PA-C  promethazine-dextromethorphan (PROMETHAZINE-DM) 6.25-15 MG/5ML syrup Take 5 mLs by mouth at bedtime as needed for cough. 06/26/21   Particia Nearing, PA-C  pseudoephedrine (SUDAFED) 60 MG tablet Take 1 tablet (60 mg total) by mouth every 8 (eight) hours as needed for congestion. 03/07/21   Wallis Bamberg, PA-C  sertraline (ZOLOFT) 50 MG tablet Take 1 tablet (50 mg total) by mouth daily. Patient not taking: No sig reported 03/08/20   Alfonso Ramus T, FNP  triamcinolone cream (KENALOG) 0.1 % Apply 1 application topically 2 (two) times daily. 02/23/21   Particia Nearing, PA-C    Family History Family History  Problem  Relation Age of Onset   Depression Mother    Depression Sister     Social History Social History   Tobacco Use   Smoking status: Former   Smokeless tobacco: Never  Building services engineer Use: Never used  Substance Use Topics   Alcohol use: Yes   Drug use: Yes    Types: Marijuana     Allergies   Patient has no known allergies.   Review of Systems Review of Systems  Genitourinary:  Positive for flank pain. Negative for dysuria, frequency, pelvic pain,  vaginal bleeding and vaginal discharge.  All other systems reviewed and are negative.   Physical Exam Triage Vital Signs ED Triage Vitals  Enc Vitals Group     BP 08/26/21 1502 126/79     Pulse Rate 08/26/21 1502 72     Resp 08/26/21 1502 16     Temp 08/26/21 1502 98.3 F (36.8 C)     Temp Source 08/26/21 1502 Oral     SpO2 08/26/21 1502 100 %     Weight --      Height --      Head Circumference --      Peak Flow --      Pain Score 08/26/21 1459 7     Pain Loc --      Pain Edu? --      Excl. in GC? --    No data found.  Updated Vital Signs BP 126/79 (BP Location: Left Arm)   Pulse 72   Temp 98.3 F (36.8 C) (Oral)   Resp 16   SpO2 100%   Visual Acuity Right Eye Distance:   Left Eye Distance:   Bilateral Distance:    Right Eye Near:   Left Eye Near:    Bilateral Near:     Physical Exam Vitals and nursing note reviewed.  Constitutional:      General: She is not in acute distress.    Appearance: Normal appearance. She is not ill-appearing, toxic-appearing or diaphoretic.  HENT:     Head: Normocephalic and atraumatic.  Eyes:     Conjunctiva/sclera: Conjunctivae normal.  Cardiovascular:     Rate and Rhythm: Normal rate.     Pulses: Normal pulses.  Pulmonary:     Effort: Pulmonary effort is normal.  Abdominal:     General: Abdomen is flat.     Tenderness: There is no right CVA tenderness or left CVA tenderness.  Musculoskeletal:        General: Normal range of motion.     Cervical back: Normal and normal range of motion.     Thoracic back: Normal.     Lumbar back: Tenderness (right side) present. No swelling, deformity, lacerations, spasms or bony tenderness. Negative right straight leg raise test and negative left straight leg raise test.  Skin:    General: Skin is warm and dry.  Neurological:     General: No focal deficit present.     Mental Status: She is alert and oriented to person, place, and time.  Psychiatric:        Mood and Affect: Mood  normal.     UC Treatments / Results  Labs (all labs ordered are listed, but only abnormal results are displayed) Labs Reviewed  POCT URINALYSIS DIPSTICK, ED / UC - Abnormal; Notable for the following components:      Result Value   Urobilinogen, UA 2.0 (*)    Nitrite POSITIVE (*)    All other components within  normal limits  POC URINE PREG, ED    EKG   Radiology No results found.  Procedures Procedures (including critical care time)  Medications Ordered in UC Medications - No data to display  Initial Impression / Assessment and Plan / UC Course  I have reviewed the triage vital signs and the nursing notes.  Pertinent labs & imaging results that were available during my care of the patient were reviewed by me and considered in my medical decision making (see chart for details).    Urinalysis with urobilinogen and nitrites.  Pregnancy test negative.  Concern for possible pyelonephritis.  Will treat with Bactrim twice daily for the next 10 days.  Encourage fluids and rest.  May take Tylenol and/or ibuprofen as needed.  May try cranberry juice, cranberry pills, AZO, or Pyridium as needed for symptom management.  Strict ED follow-up for any worsening symptoms. Final Clinical Impressions(s) / UC Diagnoses   Final diagnoses:  Flank pain  Pyelonephritis     Discharge Instructions      Take the Bactrim twice a day for the next 10 days.    You can take Tylenol and/or Ibuprofen as needed for pain relief and fever reduction.   Make sure you are drinking plenty of fluids, especially water.  You can drink cranberry juice to help with symptom relief, but make sure it is cranberry juice and not cranberry cocktail.  You can also try AZO, cranberry pills, or pyridium as needed.    Go to the ED for any worsening symptoms or pain, if you are unable to urinate, or urinate bright red blood.   If you develop the worse headache of your life, worsening dizziness, nausea/vomiting, blurred  vision, slurred speech, difficulty walking, weakness on one side, chest pain, shortness of breath, or altered mental status, call 911 or go directly to the Emergency Department for further evaluation.       ED Prescriptions     Medication Sig Dispense Auth. Provider   sulfamethoxazole-trimethoprim (BACTRIM DS) 800-160 MG tablet Take 1 tablet by mouth 2 (two) times daily for 10 days. 20 tablet Ivette Loyal, NP      PDMP not reviewed this encounter.   Ivette Loyal, NP 08/26/21 1520

## 2021-08-26 NOTE — Discharge Instructions (Signed)
Take the Bactrim twice a day for the next 10 days.    You can take Tylenol and/or Ibuprofen as needed for pain relief and fever reduction.   Make sure you are drinking plenty of fluids, especially water.  You can drink cranberry juice to help with symptom relief, but make sure it is cranberry juice and not cranberry cocktail.  You can also try AZO, cranberry pills, or pyridium as needed.    Go to the ED for any worsening symptoms or pain, if you are unable to urinate, or urinate bright red blood.   If you develop the worse headache of your life, worsening dizziness, nausea/vomiting, blurred vision, slurred speech, difficulty walking, weakness on one side, chest pain, shortness of breath, or altered mental status, call 911 or go directly to the Emergency Department for further evaluation.

## 2021-08-29 LAB — URINE CULTURE: Culture: >=100000 — AB

## 2021-09-06 ENCOUNTER — Encounter (HOSPITAL_COMMUNITY): Payer: Self-pay | Admitting: Emergency Medicine

## 2021-09-06 ENCOUNTER — Ambulatory Visit (HOSPITAL_COMMUNITY)
Admission: EM | Admit: 2021-09-06 | Discharge: 2021-09-06 | Disposition: A | Discharge status: Home / Self Care | Payer: Medicaid Other | Attending: Emergency Medicine | Admitting: Emergency Medicine

## 2021-09-06 DIAGNOSIS — G8929 Other chronic pain: Secondary | ICD-10-CM | POA: Diagnosis not present

## 2021-09-06 DIAGNOSIS — R519 Headache, unspecified: Secondary | ICD-10-CM | POA: Diagnosis not present

## 2021-09-06 DIAGNOSIS — R110 Nausea: Secondary | ICD-10-CM

## 2021-09-06 MED ORDER — KETOROLAC TROMETHAMINE 60 MG/2ML IM SOLN
60.0000 mg | Freq: Once | INTRAMUSCULAR | Status: AC
  Administered 2021-09-06: 60 mg via INTRAMUSCULAR

## 2021-09-06 MED ORDER — METHYLPREDNISOLONE SODIUM SUCC 125 MG IJ SOLR
INTRAMUSCULAR | Status: AC
  Filled 2021-09-06: qty 2

## 2021-09-06 MED ORDER — KETOROLAC TROMETHAMINE 60 MG/2ML IM SOLN
INTRAMUSCULAR | Status: AC
  Filled 2021-09-06: qty 2

## 2021-09-06 MED ORDER — METHYLPREDNISOLONE SODIUM SUCC 125 MG IJ SOLR
125.0000 mg | Freq: Once | INTRAMUSCULAR | Status: AC
  Administered 2021-09-06: 125 mg via INTRAMUSCULAR

## 2021-09-06 NOTE — ED Provider Notes (Signed)
[facility]    CSN: [id number] Arrival date & time: 09/06/21  6294      History   Chief Complaint Chief Complaint  Patient presents with   Headache    HPI Andrea Fernandez is a 20 y.o. female.   Pt here for generalized headache since last night. Took motrin with no relief. Does have a hx of headaches this is similar in nature. Denies any blurred vision. Light does make sx worse. Slight nausea no emesis.    History reviewed. No pertinent past medical history.  Patient Active Problem List   Diagnosis Date Noted   MDD (major depressive disorder), recurrent episode, moderate (HCC) 03/19/2019   Sleep disturbance 03/19/2019   Anorexia 03/19/2019   GAD (generalized anxiety disorder) 03/19/2019   Iron deficiency anemia due to chronic blood loss 06/26/2018   Menorrhagia with regular cycle 06/26/2018    History reviewed. No pertinent surgical history.  OB History   No obstetric history on file.      Home Medications    Prior to Admission medications   Medication Sig Start Date End Date Taking? Authorizing Provider  albuterol (VENTOLIN HFA) 108 (90 Base) MCG/ACT inhaler Inhale 1-2 puffs into the lungs every 6 (six) hours as needed for wheezing or shortness of breath. 03/07/21   Wallis Bamberg, PA-C  benzonatate (TESSALON) 100 MG capsule Take 1-2 capsules (100-200 mg total) by mouth 3 (three) times daily as needed for cough. 06/26/21   Particia Nearing, PA-C  cetirizine (ZYRTEC ALLERGY) 10 MG tablet Take 1 tablet (10 mg total) by mouth daily. 03/07/21   Wallis Bamberg, PA-C  Crisaborole (EUCRISA) 2 % OINT Apply 1 application topically 2 (two) times daily as needed. 02/23/21   Particia Nearing, PA-C  ferrous fumarate (HEMOCYTE - 106 MG FE) 325 (106 Fe) MG TABS tablet Take 1 tablet (106 mg of iron total) by mouth daily. 03/10/20   Verneda Skill, FNP  fluticasone (FLONASE) 50 MCG/ACT nasal spray Place 2 sprays into both nostrils daily. 03/07/21   Wallis Bamberg, PA-C   metroNIDAZOLE (METROGEL VAGINAL) 0.75 % vaginal gel Place 1 Applicatorful vaginally 2 (two) times daily. 07/13/20   Georges Mouse, NP  naproxen (NAPROSYN) 375 MG tablet Take 1 tablet (375 mg total) by mouth 2 (two) times daily with a meal. Patient not taking: No sig reported 03/11/20   Wallis Bamberg, PA-C  promethazine-dextromethorphan (PROMETHAZINE-DM) 6.25-15 MG/5ML syrup Take 5 mLs by mouth at bedtime as needed for cough. 06/26/21   Particia Nearing, PA-C  pseudoephedrine (SUDAFED) 60 MG tablet Take 1 tablet (60 mg total) by mouth every 8 (eight) hours as needed for congestion. 03/07/21   Wallis Bamberg, PA-C  sertraline (ZOLOFT) 50 MG tablet Take 1 tablet (50 mg total) by mouth daily. Patient not taking: No sig reported 03/08/20   Alfonso Ramus T, FNP  triamcinolone cream (KENALOG) 0.1 % Apply 1 application topically 2 (two) times daily. 02/23/21   Particia Nearing, PA-C    Family History Family History  Problem Relation Age of Onset   Depression Mother    Depression Sister     Social History Social History   Tobacco Use   Smoking status: Former   Smokeless tobacco: Never  Building services engineer Use: Never used  Substance Use Topics   Alcohol use: Yes   Drug use: Yes    Types: Marijuana     Allergies   Patient has no known allergies.   Review of Systems Review  of Systems  Constitutional:  Positive for fever. Negative for appetite change and chills.  HENT: Negative.    Eyes: Negative.   Respiratory: Negative.    Cardiovascular: Negative.   Gastrointestinal:  Positive for nausea. Negative for vomiting.  Genitourinary: Negative.   Neurological:  Positive for headaches. Negative for dizziness, weakness, light-headedness and numbness.    Physical Exam Triage Vital Signs ED Triage Vitals  Enc Vitals Group     BP 09/06/21 0927 115/76     Pulse Rate 09/06/21 0927 71     Resp 09/06/21 0927 18     Temp 09/06/21 0927 98.4 F (36.9 C)     Temp src --      SpO2  09/06/21 0927 98 %     Weight --      Height --      Head Circumference --      Peak Flow --      Pain Score 09/06/21 0923 7     Pain Loc --      Pain Edu? --      Excl. in GC? --    No data found.  Updated Vital Signs BP 115/76   Pulse 71   Temp 98.4 F (36.9 C)   Resp 18   SpO2 98%   Visual Acuity Right Eye Distance:   Left Eye Distance:   Bilateral Distance:    Right Eye Near:   Left Eye Near:    Bilateral Near:     Physical Exam Constitutional:      Appearance: She is well-developed.  Eyes:     Pupils: Pupils are equal, round, and reactive to light.  Cardiovascular:     Rate and Rhythm: Normal rate.  Pulmonary:     Effort: Pulmonary effort is normal.  Abdominal:     Palpations: Abdomen is soft.  Musculoskeletal:        General: Normal range of motion.     Cervical back: Normal range of motion.  Neurological:     Mental Status: She is alert. Mental status is at baseline.     GCS: GCS eye subscore is 4. GCS verbal subscore is 5. GCS motor subscore is 6.     UC Treatments / Results  Labs (all labs ordered are listed, but only abnormal results are displayed) Labs Reviewed - No data to display  EKG   Radiology No results found.  Procedures Procedures (including critical care time)  Medications Ordered in UC Medications  ketorolac (TORADOL) injection 60 mg (has no administration in time range)  methylPREDNISolone sodium succinate (SOLU-MEDROL) 125 mg/2 mL injection 125 mg (has no administration in time range)    Initial Impression / Assessment and Plan / UC Course  I have reviewed the triage vital signs and the nursing notes.  Pertinent labs & imaging results that were available during my care of the patient were reviewed by me and considered in my medical decision making (see chart for details).     Take motrin and tylenol as needed for pain  If symptoms become worse will need to go to er for further test  Stay hydrated well    Final  Clinical Impressions(s) / UC Diagnoses   Final diagnoses:  None   Discharge Instructions   None    ED Prescriptions   None    PDMP not reviewed this encounter.   Coralyn Mark, NP 09/06/21 1005

## 2021-09-06 NOTE — Discharge Instructions (Addendum)
Go to er if symptoms become worse

## 2021-09-06 NOTE — ED Triage Notes (Signed)
Pt is present today with a HA that started last night

## 2021-09-07 ENCOUNTER — Encounter (HOSPITAL_COMMUNITY): Payer: Self-pay

## 2021-09-07 ENCOUNTER — Ambulatory Visit (HOSPITAL_COMMUNITY)
Admission: EM | Admit: 2021-09-07 | Discharge: 2021-09-07 | Disposition: A | Discharge status: Home / Self Care | Payer: Medicaid Other | Attending: Emergency Medicine | Admitting: Emergency Medicine

## 2021-09-07 ENCOUNTER — Other Ambulatory Visit: Payer: Self-pay

## 2021-09-07 DIAGNOSIS — T7840XA Allergy, unspecified, initial encounter: Secondary | ICD-10-CM

## 2021-09-07 MED ORDER — FAMOTIDINE 20 MG PO TABS: 20.0000 mg | ORAL_TABLET | Freq: Two times a day (BID) | ORAL | 0 refills | Status: DC

## 2021-09-07 MED ORDER — PREDNISONE 10 MG (21) PO TBPK
ORAL_TABLET | Freq: Every day | ORAL | 0 refills | Status: DC
Start: 2021-09-07 — End: 2022-01-25

## 2021-09-07 MED ORDER — HYDROXYZINE HCL 25 MG PO TABS: 25.0000 mg | ORAL_TABLET | Freq: Four times a day (QID) | ORAL | 0 refills | Status: DC

## 2021-09-07 NOTE — ED Provider Notes (Signed)
[facility]    CSN: [id number] Arrival date & time: 09/07/21  3846      History   Chief Complaint Chief Complaint  Patient presents with   Allergic Reaction    HPI Andrea Fernandez is a 20 y.o. female.   Pt was given a Toradol and steroid injection for a headache 09/06/2021 felt better when she left. Last night began to nice a rash to her face and arms. Rash is itchy. No sob, no chest pain, no fever. Pts headache feels better.    History reviewed. No pertinent past medical history.  Patient Active Problem List   Diagnosis Date Noted   MDD (major depressive disorder), recurrent episode, moderate (HCC) 03/19/2019   Sleep disturbance 03/19/2019   Anorexia 03/19/2019   GAD (generalized anxiety disorder) 03/19/2019   Iron deficiency anemia due to chronic blood loss 06/26/2018   Menorrhagia with regular cycle 06/26/2018    History reviewed. No pertinent surgical history.  OB History   No obstetric history on file.      Home Medications    Prior to Admission medications   Medication Sig Start Date End Date Taking? Authorizing Provider  famotidine (PEPCID) 20 MG tablet Take 1 tablet (20 mg total) by mouth 2 (two) times daily. 09/07/21  Yes Coralyn Mark, NP  hydrOXYzine (ATARAX/VISTARIL) 25 MG tablet Take 1 tablet (25 mg total) by mouth every 6 (six) hours. 09/07/21  Yes Coralyn Mark, NP  predniSONE (STERAPRED UNI-PAK 21 TAB) 10 MG (21) TBPK tablet Take by mouth daily. Take 6 tabs by mouth daily  for 2 days, then 5 tabs for 2 days, then 4 tabs for 2 days, then 3 tabs for 2 days, 2 tabs for 2 days, then 1 tab by mouth daily for 2 days 09/07/21  Yes Coralyn Mark, NP  albuterol (VENTOLIN HFA) 108 (90 Base) MCG/ACT inhaler Inhale 1-2 puffs into the lungs every 6 (six) hours as needed for wheezing or shortness of breath. 03/07/21   Wallis Bamberg, PA-C  benzonatate (TESSALON) 100 MG capsule Take 1-2 capsules (100-200 mg total) by mouth 3 (three) times  daily as needed for cough. 06/26/21   Particia Nearing, PA-C  cetirizine (ZYRTEC ALLERGY) 10 MG tablet Take 1 tablet (10 mg total) by mouth daily. 03/07/21   Wallis Bamberg, PA-C  Crisaborole (EUCRISA) 2 % OINT Apply 1 application topically 2 (two) times daily as needed. 02/23/21   Particia Nearing, PA-C  ferrous fumarate (HEMOCYTE - 106 MG FE) 325 (106 Fe) MG TABS tablet Take 1 tablet (106 mg of iron total) by mouth daily. 03/10/20   Verneda Skill, FNP  fluticasone (FLONASE) 50 MCG/ACT nasal spray Place 2 sprays into both nostrils daily. 03/07/21   Wallis Bamberg, PA-C  metroNIDAZOLE (METROGEL VAGINAL) 0.75 % vaginal gel Place 1 Applicatorful vaginally 2 (two) times daily. 07/13/20   Georges Mouse, NP  naproxen (NAPROSYN) 375 MG tablet Take 1 tablet (375 mg total) by mouth 2 (two) times daily with a meal. Patient not taking: No sig reported 03/11/20   Wallis Bamberg, PA-C  promethazine-dextromethorphan (PROMETHAZINE-DM) 6.25-15 MG/5ML syrup Take 5 mLs by mouth at bedtime as needed for cough. 06/26/21   Particia Nearing, PA-C  pseudoephedrine (SUDAFED) 60 MG tablet Take 1 tablet (60 mg total) by mouth every 8 (eight) hours as needed for congestion. 03/07/21   Wallis Bamberg, PA-C  sertraline (ZOLOFT) 50 MG tablet Take 1 tablet (50 mg total) by mouth daily. Patient not taking:  No sig reported 03/08/20   Alfonso Ramus T, FNP  triamcinolone cream (KENALOG) 0.1 % Apply 1 application topically 2 (two) times daily. 02/23/21   Particia Nearing, PA-C    Family History Family History  Problem Relation Age of Onset   Depression Mother    Depression Sister     Social History Social History   Tobacco Use   Smoking status: Former   Smokeless tobacco: Never  Building services engineer Use: Never used  Substance Use Topics   Alcohol use: Yes   Drug use: Yes    Types: Marijuana     Allergies   Patient has no known allergies.   Review of Systems Review of Systems  Constitutional:   Negative for chills and fever.  Eyes: Negative.   Respiratory: Negative.    Cardiovascular: Negative.   Gastrointestinal: Negative.   Skin:  Positive for rash.       Red flat rash to face and bil arms after Toradol shot.   Neurological: Negative.     Physical Exam Triage Vital Signs ED Triage Vitals  Enc Vitals Group     BP 09/07/21 1039 124/84     Pulse Rate 09/07/21 1039 71     Resp 09/07/21 1039 19     Temp 09/07/21 1039 98.3 F (36.8 C)     Temp Source 09/07/21 1039 Oral     SpO2 09/07/21 1039 98 %     Weight --      Height --      Head Circumference --      Peak Flow --      Pain Score 09/07/21 1040 0     Pain Loc --      Pain Edu? --      Excl. in GC? --    No data found.  Updated Vital Signs BP 124/84 (BP Location: Right Arm)   Pulse 71   Temp 98.3 F (36.8 C) (Oral)   Resp 19   SpO2 98%   Visual Acuity Right Eye Distance:   Left Eye Distance:   Bilateral Distance:    Right Eye Near:   Left Eye Near:    Bilateral Near:     Physical Exam Constitutional:      Appearance: Normal appearance.  HENT:     Mouth/Throat:     Mouth: Mucous membranes are moist.  Eyes:     Pupils: Pupils are equal, round, and reactive to light.  Cardiovascular:     Rate and Rhythm: Normal rate.  Pulmonary:     Effort: Pulmonary effort is normal.  Abdominal:     General: Abdomen is flat.  Musculoskeletal:     Cervical back: Normal range of motion.  Skin:    Findings: Rash present.     Comments: Flat erythema hives type appearance to bil arms and face area.   Neurological:     General: No focal deficit present.     Mental Status: She is alert.     UC Treatments / Results  Labs (all labs ordered are listed, but only abnormal results are displayed) Labs Reviewed - No data to display  EKG   Radiology No results found.  Procedures Procedures (including critical care time)  Medications Ordered in UC Medications - No data to display  Initial Impression /  Assessment and Plan / UC Course  I have reviewed the triage vital signs and the nursing notes.  Pertinent labs & imaging results that were available during my  care of the patient were reviewed by me and considered in my medical decision making (see chart for details).     Likely an allergy to Toradol.  Don not take hydroxyzine while driving  If you become short of breath or chest pain go to er    Final Clinical Impressions(s) / UC Diagnoses   Final diagnoses:  Allergic reaction to drug, initial encounter     Discharge Instructions      Likely an allergy to Toradol.  Don not take hydroxyzine while driving  If you become short of breath or chest pain go to er      ED Prescriptions     Medication Sig Dispense Auth. Provider   predniSONE (STERAPRED UNI-PAK 21 TAB) 10 MG (21) TBPK tablet Take by mouth daily. Take 6 tabs by mouth daily  for 2 days, then 5 tabs for 2 days, then 4 tabs for 2 days, then 3 tabs for 2 days, 2 tabs for 2 days, then 1 tab by mouth daily for 2 days 42 tablet Maple Mirza L, NP   famotidine (PEPCID) 20 MG tablet Take 1 tablet (20 mg total) by mouth 2 (two) times daily. 30 tablet Maple Mirza L, NP   hydrOXYzine (ATARAX/VISTARIL) 25 MG tablet Take 1 tablet (25 mg total) by mouth every 6 (six) hours. 12 tablet Coralyn Mark, NP      PDMP not reviewed this encounter.   Coralyn Mark, NP 09/07/21 1119

## 2021-09-07 NOTE — Discharge Instructions (Addendum)
Likely an allergy to Toradol.  Don not take hydroxyzine while driving  If you become short of breath or chest pain go to er

## 2021-09-07 NOTE — ED Triage Notes (Signed)
Pt presents with c/o breaking out in hives after receiving two shots yesterday.

## 2022-01-25 ENCOUNTER — Ambulatory Visit (HOSPITAL_COMMUNITY)
Admission: EM | Admit: 2022-01-25 | Discharge: 2022-01-25 | Disposition: A | Discharge status: Home / Self Care | Payer: Medicaid Other | Attending: Nurse Practitioner | Admitting: Nurse Practitioner

## 2022-01-25 ENCOUNTER — Other Ambulatory Visit: Payer: Self-pay

## 2022-01-25 ENCOUNTER — Encounter (HOSPITAL_COMMUNITY): Payer: Self-pay

## 2022-01-25 DIAGNOSIS — Z20822 Contact with and (suspected) exposure to covid-19: Secondary | ICD-10-CM | POA: Insufficient documentation

## 2022-01-25 DIAGNOSIS — J029 Acute pharyngitis, unspecified: Secondary | ICD-10-CM | POA: Diagnosis not present

## 2022-01-25 DIAGNOSIS — N898 Other specified noninflammatory disorders of vagina: Secondary | ICD-10-CM | POA: Diagnosis not present

## 2022-01-25 DIAGNOSIS — Z202 Contact with and (suspected) exposure to infections with a predominantly sexual mode of transmission: Secondary | ICD-10-CM | POA: Insufficient documentation

## 2022-01-25 LAB — POCT RAPID STREP A, ED / UC: Streptococcus, Group A Screen (Direct): NEGATIVE

## 2022-01-25 LAB — SARS CORONAVIRUS 2 (TAT 6-24 HRS): SARS Coronavirus 2: NEGATIVE

## 2022-01-25 NOTE — ED Triage Notes (Signed)
Pt presents for STI testing. She is not having any symptoms at this time. ?

## 2022-01-25 NOTE — Discharge Instructions (Addendum)
-   Rapid strep test today is negative.  We will let you know if the throat culture comes back positive. ?- We will let you know with any positive results.  ?- Your sore throat is likely caused by a virus.  ?Some things that can make you feel better are: ?- Increased rest ?- Increasing fluid with water/sugar free electrolytes ?- Acetaminophen as needed for fever/pain.  ?- Salt water gargling, chloraseptic spray and throat lozenges ?- OTC guaifenesin (Mucinex).  ?- Saline sinus flushes or a neti pot.  ?- Humidifying the air. ?Please seek care if your symptoms do not improve over the next 7-10 days. ?

## 2022-01-25 NOTE — ED Provider Notes (Signed)
?[facility] ? ? ? ?CSN: [id number] ?Arrival date & time: 01/25/22  1101 ? ? ?  ? ?History   ?Chief Complaint ?Chief Complaint  ?Patient presents with  ? Exposure to STD  ? ? ?HPI ?Andrea Fernandez is a 21 y.o. female.  ? ?Patient presents for STI testing.  She reports white, thin, malodorous discharge for the past 3 days.  She is unable to tell if this discharge is her "normal" vaginal discharge.  She denies vaginal itching, dysuria, abdominal or back pain.  She denies vaginal sores, lesions, rashes.  She declines syphilis and HIV testing today. ? ?She also reports sore throat, cough, and congestion for the past 3 days.  The symptoms are worsen when she lays down at night and when she wakes up in the morning.  She denies fevers, nausea/vomiting, headache, rash, abdominal pain.  She denies eye redness/itchiness or nasal itchiness.  She has taken ibuprofen for the pain with some relief in the sore throat. ? ? ?History reviewed. No pertinent past medical history. ? ?Patient Active Problem List  ? Diagnosis Date Noted  ? MDD (major depressive disorder), recurrent episode, moderate (HCC) 03/19/2019  ? Sleep disturbance 03/19/2019  ? Anorexia 03/19/2019  ? GAD (generalized anxiety disorder) 03/19/2019  ? Iron deficiency anemia due to chronic blood loss 06/26/2018  ? Menorrhagia with regular cycle 06/26/2018  ? ? ?History reviewed. No pertinent surgical history. ? ?OB History   ?No obstetric history on file. ?  ? ? ? ?Home Medications   ? ?Prior to Admission medications   ?Medication Sig Start Date End Date Taking? Authorizing Provider  ?albuterol (VENTOLIN HFA) 108 (90 Base) MCG/ACT inhaler Inhale 1-2 puffs into the lungs every 6 (six) hours as needed for wheezing or shortness of breath. 03/07/21   Wallis Bamberg, PA-C  ?cetirizine (ZYRTEC ALLERGY) 10 MG tablet Take 1 tablet (10 mg total) by mouth daily. 03/07/21   Wallis Bamberg, PA-C  ?Crisaborole (EUCRISA) 2 % OINT Apply 1 application topically 2 (two) times daily as  needed. 02/23/21   Particia Nearing, PA-C  ?famotidine (PEPCID) 20 MG tablet Take 1 tablet (20 mg total) by mouth 2 (two) times daily. 09/07/21   Coralyn Mark, NP  ?ferrous fumarate (HEMOCYTE - 106 MG FE) 325 (106 Fe) MG TABS tablet Take 1 tablet (106 mg of iron total) by mouth daily. 03/10/20   Verneda Skill, FNP  ?fluticasone (FLONASE) 50 MCG/ACT nasal spray Place 2 sprays into both nostrils daily. 03/07/21   Wallis Bamberg, PA-C  ?hydrOXYzine (ATARAX/VISTARIL) 25 MG tablet Take 1 tablet (25 mg total) by mouth every 6 (six) hours. 09/07/21   Coralyn Mark, NP  ?sertraline (ZOLOFT) 50 MG tablet Take 1 tablet (50 mg total) by mouth daily. ?Patient not taking: No sig reported 03/08/20   Verneda Skill, FNP  ?triamcinolone cream (KENALOG) 0.1 % Apply 1 application topically 2 (two) times daily. 02/23/21   Particia Nearing, PA-C  ? ? ?Family History ?Family History  ?Problem Relation Age of Onset  ? Depression Mother   ? Depression Sister   ? ? ?Social History ?Social History  ? ?Tobacco Use  ? Smoking status: Former  ? Smokeless tobacco: Never  ?Vaping Use  ? Vaping Use: Never used  ?Substance Use Topics  ? Alcohol use: Yes  ? Drug use: Yes  ?  Types: Marijuana  ? ? ? ?Allergies   ?Patient has no known allergies. ? ? ?Review of Systems ?Review of Systems ?  Per HPI ? ?Physical Exam ?Triage Vital Signs ?ED Triage Vitals [01/25/22 1233]  ?Enc Vitals Group  ?   BP 136/78  ?   Pulse Rate (!) 122  ?   Resp 16  ?   Temp 98.4 ?F (36.9 ?C)  ?   Temp Source Oral  ?   SpO2 100 %  ?   Weight   ?   Height   ?   Head Circumference   ?   Peak Flow   ?   Pain Score   ?   Pain Loc   ?   Pain Edu?   ?   Excl. in GC?   ? Heart rate recheck: 66 beats per minute ? ? ?No data found. ? ?Updated Vital Signs ?BP 136/78 (BP Location: Left Arm)   Pulse (!) 122   Temp 98.4 ?F (36.9 ?C) (Oral)   Resp 16   SpO2 100%  ? ?Visual Acuity ?Right Eye Distance:   ?Left Eye Distance:   ?Bilateral Distance:   ? ?Right Eye  Near:   ?Left Eye Near:    ?Bilateral Near:    ? ?Physical Exam ?Vitals and nursing note reviewed.  ?Constitutional:   ?   General: She is not in acute distress. ?   Appearance: Normal appearance. She is not ill-appearing or toxic-appearing.  ?HENT:  ?   Head: Normocephalic and atraumatic.  ?   Right Ear: Tympanic membrane, ear canal and external ear normal.  ?   Left Ear: Tympanic membrane, ear canal and external ear normal.  ?   Nose: No congestion or rhinorrhea.  ?   Mouth/Throat:  ?   Mouth: Mucous membranes are moist.  ?   Pharynx: Oropharynx is clear. Posterior oropharyngeal erythema present. No oropharyngeal exudate.  ?   Tonsils: 1+ on the right. 1+ on the left.  ?Eyes:  ?   General: No scleral icterus. ?   Extraocular Movements: Extraocular movements intact.  ?Cardiovascular:  ?   Rate and Rhythm: Normal rate and regular rhythm.  ?   Heart sounds: Normal heart sounds. No murmur heard. ?Pulmonary:  ?   Effort: Pulmonary effort is normal. No respiratory distress.  ?   Breath sounds: Normal breath sounds. No wheezing, rhonchi or rales.  ?Genitourinary: ?   Comments: deferred ?Musculoskeletal:  ?   Cervical back: Normal range of motion and neck supple.  ?Lymphadenopathy:  ?   Cervical: No cervical adenopathy.  ?Skin: ?   General: Skin is warm and dry.  ?   Coloration: Skin is not jaundiced or pale.  ?   Findings: No erythema or rash.  ?Neurological:  ?   Mental Status: She is alert and oriented to person, place, and time.  ? ? ? ?UC Treatments / Results  ?Labs ?(all labs ordered are listed, but only abnormal results are displayed) ?Labs Reviewed  ?SARS CORONAVIRUS 2 (TAT 6-24 HRS)  ?POCT RAPID STREP A, ED / UC  ?CERVICOVAGINAL ANCILLARY ONLY  ? ? ?EKG ? ? ?Radiology ?No results found. ? ?Procedures ?Procedures (including critical care time) ? ?Medications Ordered in UC ?Medications - No data to display ? ?Initial Impression / Assessment and Plan / UC Course  ?I have reviewed the triage vital signs and the  nursing notes. ? ?Pertinent labs & imaging results that were available during my care of the patient were reviewed by me and considered in my medical decision making (see chart for details). ? ?  ?Will screening for STI  as well as bacterial vaginosis and candida and treat as indicated.  Patient declines HIV and syphilis testing today.  ? ?Acute pharyngitis is likely secondary to acute upper respiratory illness.  Rapid strep test today is negative.  Will send throat culture to rule out as well as COVID-19 testing.  Encouraged symptomatic management with warm liquids with honey/lemon, guaifenesin, push fluids, ibuprofen/Tylenol, saline nasal rinses, and humidifying air.   ?Final Clinical Impressions(s) / UC Diagnoses  ? ?Final diagnoses:  ?Acute pharyngitis, unspecified etiology  ?Vaginal discharge  ? ? ? ?Discharge Instructions   ? ?  ?- Rapid strep test today is negative.  We will let you know if the throat culture comes back positive. ?- We will let you know with any positive results.  ?- Your sore throat is likely caused by a virus.  ?Some things that can make you feel better are: ?- Increased rest ?- Increasing fluid with water/sugar free electrolytes ?- Acetaminophen as needed for fever/pain.  ?- Salt water gargling, chloraseptic spray and throat lozenges ?- OTC guaifenesin (Mucinex).  ?- Saline sinus flushes or a neti pot.  ?- Humidifying the air. ?Please seek care if your symptoms do not improve over the next 7-10 days. ? ? ? ? ?ED Prescriptions   ?None ?  ? ?PDMP not reviewed this encounter. ?  ?Valentino NoseMartinez, Tamikia Chowning A, NP ?01/25/22 1411 ? ?

## 2022-01-26 ENCOUNTER — Telehealth (HOSPITAL_COMMUNITY): Payer: Self-pay | Admitting: Emergency Medicine

## 2022-01-26 LAB — CERVICOVAGINAL ANCILLARY ONLY
Bacterial Vaginitis (gardnerella): POSITIVE — AB
Candida Glabrata: NEGATIVE
Candida Vaginitis: POSITIVE — AB
Chlamydia: NEGATIVE
Comment: NEGATIVE
Comment: NEGATIVE
Comment: NEGATIVE
Comment: NEGATIVE
Comment: NEGATIVE
Comment: NORMAL
Neisseria Gonorrhea: NEGATIVE
Trichomonas: POSITIVE — AB

## 2022-01-26 MED ORDER — METRONIDAZOLE 500 MG PO TABS
500.0000 mg | ORAL_TABLET | Freq: Two times a day (BID) | ORAL | 0 refills | Status: DC
Start: 2022-01-26 — End: 2022-04-17

## 2022-01-26 MED ORDER — FLUCONAZOLE 150 MG PO TABS: 150.0000 mg | ORAL_TABLET | Freq: Once | ORAL | 0 refills | Status: AC

## 2022-01-28 LAB — CULTURE, GROUP A STREP (THRC)

## 2022-03-06 DIAGNOSIS — H5213 Myopia, bilateral: Secondary | ICD-10-CM | POA: Diagnosis not present

## 2022-04-17 ENCOUNTER — Ambulatory Visit
Admission: EM | Admit: 2022-04-17 | Discharge: 2022-04-17 | Disposition: A | Discharge status: Home / Self Care | Payer: Medicaid Other | Attending: Internal Medicine | Admitting: Internal Medicine

## 2022-04-17 DIAGNOSIS — Z20822 Contact with and (suspected) exposure to covid-19: Secondary | ICD-10-CM

## 2022-04-17 NOTE — Discharge Instructions (Addendum)
We will call you with recommendations if labs are abnormal. 

## 2022-04-17 NOTE — ED Provider Notes (Signed)
[facility]    CSN: [id number] Arrival date & time: 04/17/22  1437      History   Chief Complaint Chief Complaint  Patient presents with   covid test for work    HPI Andrea Fernandez is a 21 y.o. female comes to urgent care for COVID-19 testing.  Patient was exposed to a coworker who tested positive for COVID-19.  Patient has no symptoms.Marland Kitchen   HPI  History reviewed. No pertinent past medical history.  Patient Active Problem List   Diagnosis Date Noted   MDD (major depressive disorder), recurrent episode, moderate (HCC) 03/19/2019   Sleep disturbance 03/19/2019   Anorexia 03/19/2019   GAD (generalized anxiety disorder) 03/19/2019   Iron deficiency anemia due to chronic blood loss 06/26/2018   Menorrhagia with regular cycle 06/26/2018    History reviewed. No pertinent surgical history.  OB History   No obstetric history on file.      Home Medications    Prior to Admission medications   Medication Sig Start Date End Date Taking? Authorizing Provider  albuterol (VENTOLIN HFA) 108 (90 Base) MCG/ACT inhaler Inhale 1-2 puffs into the lungs every 6 (six) hours as needed for wheezing or shortness of breath. 03/07/21   Wallis Bamberg, PA-C  cetirizine (ZYRTEC ALLERGY) 10 MG tablet Take 1 tablet (10 mg total) by mouth daily. 03/07/21   Wallis Bamberg, PA-C  Crisaborole (EUCRISA) 2 % OINT Apply 1 application topically 2 (two) times daily as needed. 02/23/21   Particia Nearing, PA-C  famotidine (PEPCID) 20 MG tablet Take 1 tablet (20 mg total) by mouth 2 (two) times daily. 09/07/21   Coralyn Mark, NP  ferrous fumarate (HEMOCYTE - 106 MG FE) 325 (106 Fe) MG TABS tablet Take 1 tablet (106 mg of iron total) by mouth daily. 03/10/20   Verneda Skill, FNP  fluticasone (FLONASE) 50 MCG/ACT nasal spray Place 2 sprays into both nostrils daily. 03/07/21   Wallis Bamberg, PA-C  hydrOXYzine (ATARAX/VISTARIL) 25 MG tablet Take 1 tablet (25 mg total) by mouth every 6 (six) hours.  09/07/21   Coralyn Mark, NP  triamcinolone cream (KENALOG) 0.1 % Apply 1 application topically 2 (two) times daily. 02/23/21   Particia Nearing, PA-C    Family History Family History  Problem Relation Age of Onset   Depression Mother    Depression Sister     Social History Social History   Tobacco Use   Smoking status: Former   Smokeless tobacco: Never  Building services engineer Use: Never used  Substance Use Topics   Alcohol use: Yes   Drug use: Yes    Types: Marijuana     Allergies   Patient has no known allergies.   Review of Systems Review of Systems  All other systems reviewed and are negative.   Physical Exam Triage Vital Signs ED Triage Vitals  Enc Vitals Group     BP 04/17/22 1644 (!) 108/53     Pulse Rate 04/17/22 1643 65     Resp 04/17/22 1643 18     Temp 04/17/22 1643 98.6 F (37 C)     Temp Source 04/17/22 1643 Oral     SpO2 04/17/22 1643 98 %     Weight --      Height --      Head Circumference --      Peak Flow --      Pain Score 04/17/22 1643 0     Pain Loc --  Pain Edu? --      Excl. in GC? --    No data found.  Updated Vital Signs BP (!) 108/53 (BP Location: Right Arm)   Pulse 65   Temp 98.6 F (37 C) (Oral)   Resp 18   SpO2 98%   Visual Acuity Right Eye Distance:   Left Eye Distance:   Bilateral Distance:    Right Eye Near:   Left Eye Near:    Bilateral Near:     Physical Exam Vitals and nursing note reviewed.  Constitutional:      Appearance: Normal appearance.  Cardiovascular:     Rate and Rhythm: Normal rate and regular rhythm.     Pulses: Normal pulses.     Heart sounds: Normal heart sounds.  Pulmonary:     Effort: Pulmonary effort is normal.     Breath sounds: Normal breath sounds.  Abdominal:     General: Bowel sounds are normal.     Palpations: Abdomen is soft.  Neurological:     Mental Status: She is alert.     UC Treatments / Results  Labs (all labs ordered are listed, but only abnormal  results are displayed) Labs Reviewed  NOVEL CORONAVIRUS, NAA    EKG   Radiology No results found.  Procedures Procedures (including critical care time)  Medications Ordered in UC Medications - No data to display  Initial Impression / Assessment and Plan / UC Course  I have reviewed the triage vital signs and the nursing notes.  Pertinent labs & imaging results that were available during my care of the patient were reviewed by me and considered in my medical decision making (see chart for details).     1.  COVID-19 testing: COVID-19 PCR test has been sent Patient is advised to quarantine until COVID-19 test results are available. We will call patient with recommendations if labs are abnormal. Final Clinical Impressions(s) / UC Diagnoses   Final diagnoses:  Encounter for screening laboratory testing for COVID-19 virus in asymptomatic patient     Discharge Instructions      We will call you with recommendations if labs are abnormal.   ED Prescriptions   None    PDMP not reviewed this encounter.   Merrilee Jansky, MD 04/17/22 [phone]

## 2022-04-17 NOTE — ED Triage Notes (Signed)
Pt here for covid test for work as well as work note.

## 2022-04-18 LAB — NOVEL CORONAVIRUS, NAA: SARS-CoV-2, NAA: NOT DETECTED

## 2022-06-06 ENCOUNTER — Encounter (HOSPITAL_COMMUNITY): Payer: Self-pay

## 2022-06-06 ENCOUNTER — Ambulatory Visit (HOSPITAL_COMMUNITY)
Admission: EM | Admit: 2022-06-06 | Discharge: 2022-06-06 | Disposition: A | Discharge status: Home / Self Care | Payer: Medicaid Other | Attending: Urgent Care | Admitting: Urgent Care

## 2022-06-06 DIAGNOSIS — H01001 Unspecified blepharitis right upper eyelid: Secondary | ICD-10-CM | POA: Insufficient documentation

## 2022-06-06 DIAGNOSIS — H01004 Unspecified blepharitis left upper eyelid: Secondary | ICD-10-CM | POA: Insufficient documentation

## 2022-06-06 DIAGNOSIS — H01116 Allergic dermatitis of left eye, unspecified eyelid: Secondary | ICD-10-CM | POA: Diagnosis not present

## 2022-06-06 DIAGNOSIS — H01119 Allergic dermatitis of unspecified eye, unspecified eyelid: Secondary | ICD-10-CM | POA: Diagnosis not present

## 2022-06-06 DIAGNOSIS — H01113 Allergic dermatitis of right eye, unspecified eyelid: Secondary | ICD-10-CM

## 2022-06-06 DIAGNOSIS — Z113 Encounter for screening for infections with a predominantly sexual mode of transmission: Secondary | ICD-10-CM | POA: Insufficient documentation

## 2022-06-06 MED ORDER — AMOXICILLIN-POT CLAVULANATE 875-125 MG PO TABS: 1.0000 | ORAL_TABLET | Freq: Two times a day (BID) | ORAL | 0 refills | Status: AC

## 2022-06-06 MED ORDER — ERYTHROMYCIN 5 MG/GM OP OINT: TOPICAL_OINTMENT | OPHTHALMIC | 0 refills | Status: DC

## 2022-06-06 MED ORDER — DEXAMETHASONE SODIUM PHOSPHATE 10 MG/ML IJ SOLN
10.0000 mg | Freq: Once | INTRAMUSCULAR | Status: AC
Start: 2022-06-06 — End: 2022-06-06
  Administered 2022-06-06: 10 mg via INTRAMUSCULAR

## 2022-06-06 MED ORDER — DEXAMETHASONE SODIUM PHOSPHATE 10 MG/ML IJ SOLN
INTRAMUSCULAR | Status: AC
  Filled 2022-06-06: qty 1

## 2022-06-06 NOTE — ED Notes (Signed)
Pt states she wishes to also be checked for STDs. Denies any sxs.

## 2022-06-06 NOTE — ED Triage Notes (Signed)
Pt states bilateral eye lid swelling and yellow drainage for the past 2 days  from having eyelashes put on. Pt states she took them off yesterday,but her eyes are still swollen.

## 2022-06-06 NOTE — ED Provider Notes (Signed)
[facility]    CSN: [id number] Arrival date & time: 06/06/22  5462      History   Chief Complaint Chief Complaint  Patient presents with   Eye Problem   STD Check    HPI Andrea Fernandez is a 21 y.o. female.   Pleasant 21 year old female presents today with concern of swollen eyelids.  She states on Saturday she went and got fake eyelashes applied.  She has done this numerous times in the past but never had this reaction.  2 days after application, reports discomfort and swelling to her bilateral upper eyelids.  She took the lashes off, tried to cleanse her eyelids with warm washcloths, and took over-the-counter Benadryl.  She states there is been some drainage from the upper eyelids.  Denies any headache, change in or blurred vision, redness of her eyeballs, fever, photophobia.  No additional URI symptoms. Pt also requesting a vaginal swab to test for STDs. Denies any active symptoms or known exposures, wants a routine eval.   Eye Problem Associated symptoms: redness (upper eyelids)     History reviewed. No pertinent past medical history.  Patient Active Problem List   Diagnosis Date Noted   MDD (major depressive disorder), recurrent episode, moderate (HCC) 03/19/2019   Sleep disturbance 03/19/2019   Anorexia 03/19/2019   GAD (generalized anxiety disorder) 03/19/2019   Iron deficiency anemia due to chronic blood loss 06/26/2018   Menorrhagia with regular cycle 06/26/2018    History reviewed. No pertinent surgical history.  OB History   No obstetric history on file.      Home Medications    Prior to Admission medications   Medication Sig Start Date End Date Taking? Authorizing Provider  amoxicillin-clavulanate (AUGMENTIN) 875-125 MG tablet Take 1 tablet by mouth 2 (two) times daily with a meal for 5 days. 06/06/22 06/11/22 Yes Mauriana Dann L, PA  erythromycin ophthalmic ointment Place a 1/2 inch ribbon of ointment onto the lash margin of both eyelids  three times daily x 5 days 06/06/22  Yes Briza Bark L, PA  albuterol (VENTOLIN HFA) 108 (90 Base) MCG/ACT inhaler Inhale 1-2 puffs into the lungs every 6 (six) hours as needed for wheezing or shortness of breath. 03/07/21   Wallis Bamberg, PA-C  cetirizine (ZYRTEC ALLERGY) 10 MG tablet Take 1 tablet (10 mg total) by mouth daily. 03/07/21   Wallis Bamberg, PA-C    Family History Family History  Problem Relation Age of Onset   Depression Mother    Depression Sister     Social History Social History   Tobacco Use   Smoking status: Former   Smokeless tobacco: Never  Building services engineer Use: Never used  Substance Use Topics   Alcohol use: Yes   Drug use: Yes    Types: Marijuana     Allergies   Patient has no known allergies.   Review of Systems Review of Systems  Eyes:  Positive for redness (upper eyelids).  All other systems reviewed and are negative.    Physical Exam Triage Vital Signs ED Triage Vitals [06/06/22 0927]  Enc Vitals Group     BP 119/84     Pulse Rate 63     Resp 16     Temp 98.7 F (37.1 C)     Temp Source Oral     SpO2 96 %     Weight      Height      Head Circumference      Peak Flow  Pain Score 0     Pain Loc      Pain Edu?      Excl. in GC?    No data found.  Updated Vital Signs BP 119/84 (BP Location: Right Arm)   Pulse 63   Temp 98.7 F (37.1 C) (Oral)   Resp 16   LMP 05/06/2022 (Approximate)   SpO2 96%   Visual Acuity Right Eye Distance:   Left Eye Distance:   Bilateral Distance:    Right Eye Near:   Left Eye Near:    Bilateral Near:     Physical Exam Vitals and nursing note reviewed.  Constitutional:      General: She is not in acute distress.    Appearance: Normal appearance. She is well-developed and normal weight. She is not ill-appearing, toxic-appearing or diaphoretic.  HENT:     Head: Normocephalic and atraumatic.     Right Ear: External ear normal.     Left Ear: External ear normal.     Nose: Nose normal.  No congestion.     Mouth/Throat:     Mouth: Mucous membranes are moist.  Eyes:     General: Vision grossly intact. Gaze aligned appropriately. No allergic shiner, visual field deficit or scleral icterus.       Right eye: Discharge present. No foreign body or hordeolum.        Left eye: Discharge present.No foreign body or hordeolum.     Extraocular Movements: Extraocular movements intact.     Right eye: Normal extraocular motion and no nystagmus.     Left eye: Normal extraocular motion and no nystagmus.     Conjunctiva/sclera: Conjunctivae normal.     Right eye: Right conjunctiva is not injected. No chemosis, exudate or hemorrhage.    Left eye: Left conjunctiva is not injected. No chemosis, exudate or hemorrhage.    Pupils: Pupils are equal, round, and reactive to light.      Comments: B swollen meibomian glands with yellow flaking discharge. B upper lids erythematous, swollen. (+) transillumination. No tenderness to lacrimal apparatus  Cardiovascular:     Rate and Rhythm: Normal rate and regular rhythm.     Heart sounds: No murmur heard. Pulmonary:     Effort: Pulmonary effort is normal. No respiratory distress.     Breath sounds: Normal breath sounds.  Abdominal:     Palpations: Abdomen is soft.     Tenderness: There is no abdominal tenderness.  Musculoskeletal:        General: No swelling.     Cervical back: Neck supple.  Skin:    General: Skin is warm and dry.     Capillary Refill: Capillary refill takes less than 2 seconds.  Neurological:     Mental Status: She is alert.  Psychiatric:        Mood and Affect: Mood normal.      UC Treatments / Results  Labs (all labs ordered are listed, but only abnormal results are displayed) Labs Reviewed  CERVICOVAGINAL ANCILLARY ONLY    EKG   Radiology No results found.  Procedures Procedures (including critical care time)  Medications Ordered in UC Medications  dexamethasone (DECADRON) injection 10 mg (10 mg  Intramuscular Given 06/06/22 0955)    Initial Impression / Assessment and Plan / UC Course  I have reviewed the triage vital signs and the nursing notes.  Pertinent labs & imaging results that were available during my care of the patient were reviewed by me and considered in my medical  decision making (see chart for details).     Blepharitis - B upper lids affected.  I do have concern for possible developing  periorbital cellulitis, therefore will also add PO augmentin. Warm compresses / lid washes followed by TID emycin ophthalmic ointment.  Contact dermatitis B eyelids -dexamethasone injection given in office.  Patient may continue as needed Benadryl.  Monitor for symptom resolution. Routine STI evaluation -patient asymptomatic, Aptima swab collected.  Will call with results.   Final Clinical Impressions(s) / UC Diagnoses   Final diagnoses:  Blepharitis of upper eyelids of both eyes, unspecified type  Contact dermatitis of eyelid, unspecified laterality  Routine screening for STI (sexually transmitted infection)     Discharge Instructions      You have blepharitis, with underlying allergic contact dermatitis. Please avoid touching your eye; if you do, [facility] YOUR HANDS!  I have prescribed erythromycin eye ointment to place along to lash margin of both upper eyelids, three times daily x 5 days.  Prior to application, use a warm moist washcloths with Laural Benes and Johnson baby shampoo and gently massage and cleans your eyes. Take the oral antibiotic with food twice daily.  You were given a steroid shot in our office to help with the eyelid swelling. You may continue benadryl as needed, 25mg  three times daily.  Return to clinic if any fever, eye pain, change in vision.   You were also tested for gonorrhea, chlamydia, trichomonas, BV and yeast, per your request. We will call if any results are positive. Please avoid intercourse until test results received.      ED Prescriptions      Medication Sig Dispense Auth. Provider   erythromycin ophthalmic ointment Place a 1/2 inch ribbon of ointment onto the lash margin of both eyelids three times daily x 5 days 3.5 g Johnston Maddocks L, PA   amoxicillin-clavulanate (AUGMENTIN) 875-125 MG tablet Take 1 tablet by mouth 2 (two) times daily with a meal for 5 days. 10 tablet Margrete Delude L,      PDMP not reviewed this encounter.   [place], Maretta Bees 06/06/22 1013

## 2022-06-06 NOTE — Discharge Instructions (Addendum)
You have blepharitis, with underlying allergic contact dermatitis. Please avoid touching your eye; if you do, [facility] YOUR HANDS!  I have prescribed erythromycin eye ointment to place along to lash margin of both upper eyelids, three times daily x 5 days.  Prior to application, use a warm moist washcloths with Laural Benes and Johnson baby shampoo and gently massage and cleans your eyes. Take the oral antibiotic with food twice daily.  You were given a steroid shot in our office to help with the eyelid swelling. You may continue benadryl as needed, 25mg  three times daily.  Return to clinic if any fever, eye pain, change in vision.   You were also tested for gonorrhea, chlamydia, trichomonas, BV and yeast, per your request. We will call if any results are positive. Please avoid intercourse until test results received.

## 2022-06-07 ENCOUNTER — Telehealth (HOSPITAL_COMMUNITY): Payer: Self-pay | Admitting: Emergency Medicine

## 2022-06-07 LAB — CERVICOVAGINAL ANCILLARY ONLY
Bacterial Vaginitis (gardnerella): POSITIVE — AB
Candida Glabrata: NEGATIVE
Candida Vaginitis: POSITIVE — AB
Chlamydia: NEGATIVE
Comment: NEGATIVE
Comment: NEGATIVE
Comment: NEGATIVE
Comment: NEGATIVE
Comment: NEGATIVE
Comment: NORMAL
Neisseria Gonorrhea: NEGATIVE
Trichomonas: NEGATIVE

## 2022-06-07 MED ORDER — FLUCONAZOLE 150 MG PO TABS: 150.0000 mg | ORAL_TABLET | Freq: Once | ORAL | 0 refills | Status: AC

## 2022-06-07 MED ORDER — METRONIDAZOLE 500 MG PO TABS: 500.0000 mg | ORAL_TABLET | Freq: Two times a day (BID) | ORAL | 0 refills | Status: DC

## 2022-07-25 ENCOUNTER — Encounter (HOSPITAL_COMMUNITY): Payer: Self-pay | Admitting: *Deleted

## 2022-07-25 ENCOUNTER — Ambulatory Visit (HOSPITAL_COMMUNITY)
Admission: EM | Admit: 2022-07-25 | Discharge: 2022-07-25 | Disposition: A | Discharge status: Home / Self Care | Payer: Medicaid Other | Attending: Physician Assistant | Admitting: Physician Assistant

## 2022-07-25 DIAGNOSIS — J069 Acute upper respiratory infection, unspecified: Secondary | ICD-10-CM

## 2022-07-25 DIAGNOSIS — R051 Acute cough: Secondary | ICD-10-CM | POA: Insufficient documentation

## 2022-07-25 DIAGNOSIS — J029 Acute pharyngitis, unspecified: Secondary | ICD-10-CM | POA: Diagnosis not present

## 2022-07-25 LAB — POCT RAPID STREP A, ED / UC: Streptococcus, Group A Screen (Direct): NEGATIVE

## 2022-07-25 NOTE — ED Triage Notes (Signed)
Pt state that cough, sore throat and ear pain started last night. She did take some nyquil without relief.

## 2022-07-25 NOTE — ED Provider Notes (Signed)
[facility]    CSN: [id number] Arrival date & time: 07/25/22  1131      History   Chief Complaint Chief Complaint  Patient presents with   Cough   Otalgia   Sore Throat    HPI Andrea Fernandez is a 21 y.o. female.   21 year old female presents with sore throat and congestion.  Patient indicates for the past 24 hours she has been having sore throat and painful swallowing.  She also relates having upper respiratory congestion, intermittent cough, clear production, and intermittent left ear pain and tenderness.  Patient relates she is not having fever or chills, she has not been around any family or friends that have been sick.  She indicates that she used NyQuil last night and DayQuil this morning but these have not improved her symptoms so far.  Patient is tolerating fluids well.   Cough Associated symptoms: ear pain and sore throat   Otalgia Associated symptoms: cough and sore throat   Sore Throat    History reviewed. No pertinent past medical history.  Patient Active Problem List   Diagnosis Date Noted   MDD (major depressive disorder), recurrent episode, moderate (HCC) 03/19/2019   Sleep disturbance 03/19/2019   Anorexia 03/19/2019   GAD (generalized anxiety disorder) 03/19/2019   Iron deficiency anemia due to chronic blood loss 06/26/2018   Menorrhagia with regular cycle 06/26/2018    History reviewed. No pertinent surgical history.  OB History   No obstetric history on file.      Home Medications    Prior to Admission medications   Medication Sig Start Date End Date Taking? Authorizing Provider  albuterol (VENTOLIN HFA) 108 (90 Base) MCG/ACT inhaler Inhale 1-2 puffs into the lungs every 6 (six) hours as needed for wheezing or shortness of breath. 03/07/21   Wallis Bamberg, PA-C  cetirizine (ZYRTEC ALLERGY) 10 MG tablet Take 1 tablet (10 mg total) by mouth daily. 03/07/21   Wallis Bamberg, PA-C  erythromycin ophthalmic ointment Place a 1/2 inch ribbon of  ointment onto the lash margin of both eyelids three times daily x 5 days 06/06/22   Guy Sandifer L, PA  metroNIDAZOLE (FLAGYL) 500 MG tablet Take 1 tablet (500 mg total) by mouth 2 (two) times daily. 06/07/22   Lamptey, Britta Mccreedy, MD    Family History Family History  Problem Relation Age of Onset   Depression Mother    Depression Sister     Social History Social History   Tobacco Use   Smoking status: Former   Smokeless tobacco: Never  Building services engineer Use: Never used  Substance Use Topics   Alcohol use: Yes   Drug use: Yes    Types: Marijuana     Allergies   Patient has no known allergies.   Review of Systems Review of Systems  HENT:  Positive for ear pain and sore throat.   Respiratory:  Positive for cough.      Physical Exam Triage Vital Signs ED Triage Vitals  Enc Vitals Group     BP 07/25/22 1241 (!) 133/96     Pulse Rate 07/25/22 1241 67     Resp 07/25/22 1241 18     Temp 07/25/22 1241 98.2 F (36.8 C)     Temp Source 07/25/22 1241 Oral     SpO2 07/25/22 1241 96 %     Weight --      Height --      Head Circumference --  Peak Flow --      Pain Score 07/25/22 1240 7     Pain Loc --      Pain Edu? --      Excl. in GC? --    No data found.  Updated Vital Signs BP (!) 133/96 (BP Location: Left Arm)   Pulse 67   Temp 98.2 F (36.8 C) (Oral)   Resp 18   LMP 07/17/2022 (Approximate)   SpO2 96%   Visual Acuity Right Eye Distance:   Left Eye Distance:   Bilateral Distance:    Right Eye Near:   Left Eye Near:    Bilateral Near:     Physical Exam Constitutional:      Appearance: She is well-developed.  HENT:     Right Ear: Ear canal normal. Tympanic membrane is injected.     Left Ear: Ear canal normal. Tympanic membrane is injected.     Mouth/Throat:     Mouth: Mucous membranes are moist.     Pharynx: Posterior oropharyngeal erythema present. No pharyngeal swelling or oropharyngeal exudate.  Cardiovascular:     Rate and Rhythm:  Normal rate and regular rhythm.     Heart sounds: Normal heart sounds.  Pulmonary:     Effort: Pulmonary effort is normal.     Breath sounds: Normal breath sounds and air entry. No wheezing, rhonchi or rales.  Lymphadenopathy:     Cervical: No cervical adenopathy.  Neurological:     Mental Status: She is alert.      UC Treatments / Results  Labs (all labs ordered are listed, but only abnormal results are displayed) Labs Reviewed  CULTURE, GROUP A STREP [facility])  POCT RAPID STREP A, ED / UC    EKG   Radiology No results found.  Procedures Procedures (including critical care time)  Medications Ordered in UC Medications - No data to display  Initial Impression / Assessment and Plan / UC Course  I have reviewed the triage vital signs and the nursing notes.  Pertinent labs & imaging results that were available during my care of the patient were reviewed by me and considered in my medical decision making (see chart for details).       Plan: 1.  Throat culture is pending. 2.  Advised to continue DayQuil and NyQuil to control symptoms. 3.  Advised to follow-up with PCP or return to urgent care if symptoms fail to improve within the next week to 10 days. Final Clinical Impressions(s) / UC Diagnoses   Final diagnoses:  Pharyngitis, unspecified etiology  Viral upper respiratory tract infection  Acute cough     Discharge Instructions      Advised to continue using DayQuil and NyQuil to help control the symptoms of the cough and congestion. Throat culture is pending for strep throat and will be completed in 48 hours, if you do not hear from this office that indicates that the results are negative, you can go on MyChart review the results at 24 and 48 hours. This is a viral infection and should resolve within the next 7 to 10 days or sooner, if symptoms fail to improve or worsen then follow-up with your PCP or return to urgent care for evaluation.     ED Prescriptions    None    PDMP not reviewed this encounter.   Ellsworth Lennox, PA-C 07/25/22 1319

## 2022-07-25 NOTE — Discharge Instructions (Addendum)
Advised to continue using DayQuil and NyQuil to help control the symptoms of the cough and congestion. Throat culture is pending for strep throat and will be completed in 48 hours, if you do not hear from this office that indicates that the results are negative, you can go on MyChart review the results at 24 and 48 hours. This is a viral infection and should resolve within the next 7 to 10 days or sooner, if symptoms fail to improve or worsen then follow-up with your PCP or return to urgent care for evaluation.

## 2022-07-27 LAB — CULTURE, GROUP A STREP (THRC)

## 2022-10-03 ENCOUNTER — Ambulatory Visit
Admission: EM | Admit: 2022-10-03 | Discharge: 2022-10-03 | Disposition: A | Discharge status: Home / Self Care | Payer: Medicaid Other | Attending: Physician Assistant | Admitting: Physician Assistant

## 2022-10-03 DIAGNOSIS — N898 Other specified noninflammatory disorders of vagina: Secondary | ICD-10-CM | POA: Diagnosis not present

## 2022-10-03 DIAGNOSIS — N3000 Acute cystitis without hematuria: Secondary | ICD-10-CM | POA: Insufficient documentation

## 2022-10-03 HISTORY — DX: Unspecified asthma, uncomplicated: J45.909

## 2022-10-03 LAB — POCT URINE PREGNANCY: Preg Test, Ur: NEGATIVE

## 2022-10-03 LAB — POCT URINALYSIS DIP (MANUAL ENTRY)
Bilirubin, UA: NEGATIVE
Glucose, UA: NEGATIVE mg/dL
Ketones, POC UA: NEGATIVE mg/dL
Nitrite, UA: POSITIVE — AB
Spec Grav, UA: 1.025 (ref 1.010–1.025)
Urobilinogen, UA: 1 E.U./dL
pH, UA: 7 (ref 5.0–8.0)

## 2022-10-03 MED ORDER — NITROFURANTOIN MONOHYD MACRO 100 MG PO CAPS: 100.0000 mg | ORAL_CAPSULE | Freq: Two times a day (BID) | ORAL | 0 refills | Status: DC

## 2022-10-03 NOTE — ED Triage Notes (Signed)
Pt presents with concern for vaginal discharge that is white and just started a few days ago with new onset right sided flank pain. Pt reports pain is constant and 6/10 on the pain scale. Pt concerned for sti/ bv/ yeast. Denies any dysuria.

## 2022-10-03 NOTE — ED Provider Notes (Signed)
[facility]    CSN: [id number] Arrival date & time: 10/03/22  0807      History   Chief Complaint Chief Complaint  Patient presents with   Flank Pain   sti check     HPI Andrea Fernandez is a 21 y.o. female.   Patient here today for evaluation of vaginal discharge that started few days ago.  She reports that now she is having some right-sided flank pain.  She denies any dysuria.  She has not had any nausea or vomiting.  She reports her pain is a 6 out of 10.  She would like STD screening.  She denies any known STD exposure.  The history is provided by the patient.  Flank Pain Pertinent negatives include no abdominal pain.    Past Medical History:  Diagnosis Date   Asthma     Patient Active Problem List   Diagnosis Date Noted   MDD (major depressive disorder), recurrent episode, moderate (HCC) 03/19/2019   Sleep disturbance 03/19/2019   Anorexia 03/19/2019   GAD (generalized anxiety disorder) 03/19/2019   Iron deficiency anemia due to chronic blood loss 06/26/2018   Menorrhagia with regular cycle 06/26/2018    History reviewed. No pertinent surgical history.  OB History   No obstetric history on file.      Home Medications    Prior to Admission medications   Medication Sig Start Date End Date Taking? Authorizing Provider  nitrofurantoin, macrocrystal-monohydrate, (MACROBID) 100 MG capsule Take 1 capsule (100 mg total) by mouth 2 (two) times daily. 10/03/22  Yes Tomi Bamberger, PA-C  albuterol (VENTOLIN HFA) 108 (90 Base) MCG/ACT inhaler Inhale 1-2 puffs into the lungs every 6 (six) hours as needed for wheezing or shortness of breath. 03/07/21   Wallis Bamberg, PA-C  cetirizine (ZYRTEC ALLERGY) 10 MG tablet Take 1 tablet (10 mg total) by mouth daily. Patient not taking: Reported on 10/03/2022 03/07/21   Wallis Bamberg, PA-C  erythromycin ophthalmic ointment Place a 1/2 inch ribbon of ointment onto the lash margin of both eyelids three times daily x 5  days Patient not taking: Reported on 10/03/2022 06/06/22   Guy Sandifer L, PA  metroNIDAZOLE (FLAGYL) 500 MG tablet Take 1 tablet (500 mg total) by mouth 2 (two) times daily. Patient not taking: Reported on 10/03/2022 06/07/22   Lamptey, Britta Mccreedy, MD    Family History Family History  Problem Relation Age of Onset   Depression Mother    Depression Sister     Social History Social History   Tobacco Use   Smoking status: Former   Smokeless tobacco: Never  Building services engineer Use: Never used  Substance Use Topics   Alcohol use: Yes    Comment: 2 times a week   Drug use: Yes    Types: Marijuana     Allergies   Patient has no known allergies.   Review of Systems Review of Systems  Constitutional:  Negative for chills and fever.  Eyes:  Negative for discharge and redness.  Gastrointestinal:  Negative for abdominal pain, nausea and vomiting.  Genitourinary:  Positive for flank pain and vaginal discharge. Negative for dysuria.     Physical Exam Triage Vital Signs ED Triage Vitals  Enc Vitals Group     BP 10/03/22 0840 117/82     Pulse Rate 10/03/22 0840 82     Resp 10/03/22 0840 17     Temp 10/03/22 0840 (!) 97.5 F (36.4 C)     Temp  Source 10/03/22 0840 Oral     SpO2 10/03/22 0840 97 %     Weight --      Height --      Head Circumference --      Peak Flow --      Pain Score 10/03/22 0838 6     Pain Loc --      Pain Edu? --      Excl. in GC? --    No data found.  Updated Vital Signs BP 117/82   Pulse 82   Temp (!) 97.5 F (36.4 C) (Oral)   Resp 17   LMP 09/05/2022 (Exact Date)   SpO2 97%      Physical Exam Vitals and nursing note reviewed.  Constitutional:      General: She is not in acute distress.    Appearance: Normal appearance. She is not ill-appearing.  HENT:     Head: Normocephalic and atraumatic.  Eyes:     Conjunctiva/sclera: Conjunctivae normal.  Cardiovascular:     Rate and Rhythm: Normal rate.  Pulmonary:     Effort: Pulmonary  effort is normal.  Neurological:     Mental Status: She is alert.  Psychiatric:        Mood and Affect: Mood normal.        Behavior: Behavior normal.        Thought Content: Thought content normal.      UC Treatments / Results  Labs (all labs ordered are listed, but only abnormal results are displayed) Labs Reviewed  POCT URINALYSIS DIP (MANUAL ENTRY) - Abnormal; Notable for the following components:      Result Value   Clarity, UA cloudy (*)    Blood, UA small (*)    Protein Ur, POC trace (*)    Nitrite, UA Positive (*)    Leukocytes, UA Trace (*)    All other components within normal limits  URINE CULTURE  POCT URINE PREGNANCY  CERVICOVAGINAL ANCILLARY ONLY    EKG   Radiology No results found.  Procedures Procedures (including critical care time)  Medications Ordered in UC Medications - No data to display  Initial Impression / Assessment and Plan / UC Course  I have reviewed the triage vital signs and the nursing notes.  Pertinent labs & imaging results that were available during my care of the patient were reviewed by me and considered in my medical decision making (see chart for details).    Macrobid prescribed for UTI coverage, urine culture ordered.  Will screen for yeast, BV, GC, chlamydia and trichomonas.  Patient declines blood work today.  Encouraged follow-up with any further concerns.  Final Clinical Impressions(s) / UC Diagnoses   Final diagnoses:  Acute cystitis without hematuria  Vaginal discharge   Discharge Instructions   None    ED Prescriptions     Medication Sig Dispense Auth. Provider   nitrofurantoin, macrocrystal-monohydrate, (MACROBID) 100 MG capsule Take 1 capsule (100 mg total) by mouth 2 (two) times daily. 10 capsule Tomi Bamberger, PA-C      PDMP not reviewed this encounter.   Tomi Bamberger, PA-C 10/03/22 1011

## 2022-10-04 LAB — CERVICOVAGINAL ANCILLARY ONLY
Bacterial Vaginitis (gardnerella): POSITIVE — AB
Candida Glabrata: NEGATIVE
Candida Vaginitis: POSITIVE — AB
Chlamydia: NEGATIVE
Comment: NEGATIVE
Comment: NEGATIVE
Comment: NEGATIVE
Comment: NEGATIVE
Comment: NEGATIVE
Comment: NORMAL
Neisseria Gonorrhea: NEGATIVE
Trichomonas: NEGATIVE

## 2022-10-05 LAB — URINE CULTURE: Culture: >=100000 — AB

## 2022-10-06 ENCOUNTER — Telehealth (HOSPITAL_COMMUNITY): Payer: Self-pay

## 2022-10-06 DIAGNOSIS — B9689 Other specified bacterial agents as the cause of diseases classified elsewhere: Secondary | ICD-10-CM

## 2022-10-06 DIAGNOSIS — B379 Candidiasis, unspecified: Secondary | ICD-10-CM

## 2022-10-06 MED ORDER — METRONIDAZOLE 500 MG PO TABS: 500.0000 mg | ORAL_TABLET | Freq: Two times a day (BID) | ORAL | 0 refills | Status: DC

## 2022-10-06 MED ORDER — FLUCONAZOLE 150 MG PO TABS: 150.0000 mg | ORAL_TABLET | Freq: Every day | ORAL | 0 refills | Status: DC

## 2022-10-06 NOTE — Telephone Encounter (Signed)
TC to pt.  Positive for BV and Yeast.  Pt confirmed she is not pregnant or breastfeeding.  Sending Flagyl and Diflucan.  Pt denies any further questions.

## 2022-12-21 ENCOUNTER — Ambulatory Visit: Payer: BC Managed Care – PPO | Admitting: Family

## 2022-12-21 ENCOUNTER — Encounter: Payer: Self-pay | Admitting: Family

## 2022-12-21 ENCOUNTER — Other Ambulatory Visit: Payer: Self-pay | Admitting: Family

## 2022-12-21 VITALS — BP 114/76 | HR 76 | Temp 97.5°F | Ht 63.0 in | Wt 112.4 lb

## 2022-12-21 DIAGNOSIS — J4520 Mild intermittent asthma, uncomplicated: Secondary | ICD-10-CM | POA: Diagnosis not present

## 2022-12-21 DIAGNOSIS — L509 Urticaria, unspecified: Secondary | ICD-10-CM

## 2022-12-21 MED ORDER — ALBUTEROL SULFATE HFA 108 (90 BASE) MCG/ACT IN AERS: 1.0000 | INHALATION_SPRAY | Freq: Four times a day (QID) | RESPIRATORY_TRACT | 2 refills | Status: DC | PRN

## 2022-12-21 NOTE — Patient Instructions (Signed)
Welcome to [facility], It was a pleasure meeting you today!    As discussed, I have sent your Albuterol refill to your pharmacy.  I have sent a referral to the Allergy & Asthma clinic.      PLEASE NOTE: If you had any LAB tests please let us know if you have not heard back within a few days. You may see your results on MyChart before we have a chance to review them but we will give you a call once they are reviewed by [place]. If we ordered any REFERRALS today, please let us know if you have not heard from their office within the next week.  Let us know through MyChart if you are needing REFILLS, or have your pharmacy send [place] the request. You can also use MyChart to communicate with me or any office staff.  Please try these tips to maintain a healthy lifestyle: It is important that you exercise regularly at least 30 minutes 5 times a week. Think about what you will eat, plan ahead. Choose whole foods, & think  "clean, green, fresh or frozen" over canned, processed or packaged foods which are more sugary, salty, and fatty. 70 to 75% of food eaten should be fresh vegetables and protein. 2-3  meals daily with healthy snacks between meals, but must be whole fruit, protein or vegetables. Aim to eat over a 10 hour period when you are active, for example, 7am to 5pm, and then STOP after your last meal of the day, drinking only water.  Shorter eating windows, 6-8 hours, are showing benefits in heart disease and blood sugar regulation. Drink water every day! Shoot for 64 ounces daily = 8 cups, no other drink is as healthy! Fruit juice is best enjoyed in a healthy way, by EATING the fruit.

## 2022-12-21 NOTE — Progress Notes (Signed)
New Patient Office Visit  Subjective:  Patient ID: Andrea Fernandez, female    DOB: 2001-10-05  Age: 22 y.o. MRN: [id number]  CC:  Chief Complaint  Patient presents with   New Patient (Initial Visit)   Allergic Reaction    Referral for allergy testing.    Asthma    Medication refill    HPI Andrea Fernandez presents for establishing care today.  Allergies:  pt reports breaking out with hives and itchy all over, sometimes on chest, arms, neck, or legs, other times in more than one location. Happens mostly at home, one time at work, has not changed any of her cleaning products, detergent, no new living situation, no new foods that she can remember.  Asthma: since childhood, mostly exercise induced. Uses Albuterol inhaler prn, does not need often.  Assessment & Plan:   Problem List Items Addressed This Visit       Respiratory   Mild intermittent asthma without complication - Primary    chronic, stable intermittent, mostly exercise-induced, uses Albuterol prn sending refill referring to Allergy/Asthma mainly for Allergies, but can discuss Asthma if needed in future f/u prn      Relevant Medications   albuterol (VENTOLIN HFA) 108 (90 Base) MCG/ACT inhaler   Other Relevant Orders   Ambulatory referral to Allergy   Other Visit Diagnoses     Hives    - started having itchy breakouts off and on since before Christmas and unsure as to why, sending referral to Allergy center for testing.    Relevant Orders   Ambulatory referral to Allergy   Subjective:    Outpatient Medications Prior to Visit  Medication Sig Dispense Refill   albuterol (VENTOLIN HFA) 108 (90 Base) MCG/ACT inhaler Inhale 1-2 puffs into the lungs every 6 (six) hours as needed for wheezing or shortness of breath. 18 g 0   erythromycin ophthalmic ointment Place a 1/2 inch ribbon of ointment onto the lash margin of both eyelids three times daily x 5 days 3.5 g 0   fluconazole (DIFLUCAN) 150 MG tablet Take 1 tablet  (150 mg total) by mouth daily. Can repeat in 3 days if needed. 2 tablet 0   metroNIDAZOLE (FLAGYL) 500 MG tablet Take 1 tablet (500 mg total) by mouth 2 (two) times daily. 14 tablet 0   nitrofurantoin, macrocrystal-monohydrate, (MACROBID) 100 MG capsule Take 1 capsule (100 mg total) by mouth 2 (two) times daily. 10 capsule 0   cetirizine (ZYRTEC ALLERGY) 10 MG tablet Take 1 tablet (10 mg total) by mouth daily. (Patient not taking: Reported on 12/21/2022) 30 tablet 0   No facility-administered medications prior to visit.   Past Medical History:  Diagnosis Date   Allergy    Anemia    Anxiety    Asthma    Sickle cell anemia ([place])    History reviewed. No pertinent surgical history.  Objective:   Today's Vitals: BP 114/76 (BP Location: Left Arm, Patient Position: Sitting, Cuff Size: Large)   Pulse 76   Temp (!) 97.5 F (36.4 C) (Temporal)   Ht 5\' 3"  (1.6 m)   Wt 112 lb 6 oz (51 kg)   LMP 12/14/2022 (Exact Date)   SpO2 100%   BMI 19.91 kg/m   Physical Exam Vitals and nursing note reviewed.  Constitutional:      Appearance: Normal appearance.  Cardiovascular:     Rate and Rhythm: Normal rate and regular rhythm.  Pulmonary:     Effort: Pulmonary effort is normal.  Breath sounds: Normal breath sounds.  Musculoskeletal:        General: Normal range of motion.  Skin:    General: Skin is warm and dry.  Neurological:     Mental Status: She is alert.  Psychiatric:        Mood and Affect: Mood normal.        Behavior: Behavior normal.     Meds ordered this encounter  Medications   albuterol (VENTOLIN HFA) 108 (90 Base) MCG/ACT inhaler    Sig: Inhale 1-2 puffs into the lungs every 6 (six) hours as needed for wheezing or shortness of breath.    Dispense:  18 g    Refill:  2    Order Specific Question:   Supervising Provider    Answer:   ANDY, CAMILLE L [[id number]]    Jeanie Sewer, NP

## 2022-12-21 NOTE — Assessment & Plan Note (Signed)
chronic, stable intermittent, mostly exercise-induced, uses Albuterol prn sending refill referring to Allergy/Asthma mainly for Allergies, but can discuss Asthma if needed in future f/u prn

## 2023-01-08 ENCOUNTER — Ambulatory Visit (INDEPENDENT_AMBULATORY_CARE_PROVIDER_SITE_OTHER): Payer: BC Managed Care – PPO | Admitting: Family

## 2023-01-08 ENCOUNTER — Encounter: Payer: Self-pay | Admitting: Family

## 2023-01-08 VITALS — BP 132/91 | HR 74 | Temp 97.8°F | Ht 63.0 in | Wt 110.1 lb

## 2023-01-08 DIAGNOSIS — J029 Acute pharyngitis, unspecified: Secondary | ICD-10-CM | POA: Diagnosis not present

## 2023-01-08 DIAGNOSIS — U071 COVID-19: Secondary | ICD-10-CM

## 2023-01-08 LAB — POCT RAPID STREP A (OFFICE): Rapid Strep A Screen: NEGATIVE

## 2023-01-08 LAB — POC COVID19 BINAXNOW: SARS Coronavirus 2 Ag: POSITIVE — AB

## 2023-01-08 MED ORDER — NIRMATRELVIR/RITONAVIR (PAXLOVID)TABLET: 3.0000 | ORAL_TABLET | Freq: Two times a day (BID) | ORAL | 0 refills | Status: AC

## 2023-01-08 NOTE — Patient Instructions (Signed)
.  shavs

## 2023-01-08 NOTE — Progress Notes (Signed)
Patient ID: Andrea Fernandez, female    DOB: 12-11-2000, 22 y.o.   MRN: [id number]  Chief Complaint  Patient presents with   Sinus Problem    sx for 4d    HPI:      URI sx:    Pt c/o Nasal congestion, fatigue, sore throat, body aches and a dry cough for 4 days. Dayquil, nyquil, tea and mucinex which did not help. Also reports lack of appetite, but no nausea. Has had a little diarrhea, watery, several times a day since sx started.  Assessment & Plan:  1. Sore throat - rapid strep neg.  Advised pt to take Ibuprofen '600mg'$  tid prn for sore throat pain, swelling, and fever. Gargle with warm salt water several tid. OK to use OTC Chloraseptic spray and/or throat lozenges prn. Drink plenty of water.   - POCT rapid strep A - POC COVID-19  2. COVID-19 sending Paxlovid, advised on use & SE, pt is at end of effective window, advised to not spend too much money if copay high. Advised of CDC guidelines for masking if out in public. OK to continue taking OTC sinus or pain meds. Encouraged to monitor & notify office of any worsening symptoms: increased shortness of breath, weakness, and signs of dehydration. Instructed to rest and hydrate well.   - POCT rapid strep A - POC COVID-19 - nirmatrelvir/ritonavir (PAXLOVID) 20 x 150 MG & 10 x '100MG'$  TABS; Take 3 tablets by mouth 2 (two) times daily for 5 days. (Take nirmatrelvir 150 mg two tablets twice daily for 5 days and ritonavir 100 mg one tablet twice daily for 5 days)  Dispense: 30 tablet; Refill: 0  Subjective:    Outpatient Medications Prior to Visit  Medication Sig Dispense Refill   levalbuterol (XOPENEX HFA) 45 MCG/ACT inhaler Inhale 2 puffs into the lungs every 6 (six) hours as needed for wheezing or shortness of breath. 1 each 2   No facility-administered medications prior to visit.   Past Medical History:  Diagnosis Date   Allergy    Anemia    Anxiety    Asthma    Sickle cell anemia ([place])    No past surgical history on file. No Known  Allergies    Objective:    Physical Exam Vitals and nursing note reviewed.  Constitutional:      Appearance: Normal appearance. She is ill-appearing.     Interventions: Face mask in place.  HENT:     Right Ear: Tympanic membrane and ear canal normal.     Left Ear: Tympanic membrane and ear canal normal.     Nose:     Right Sinus: Frontal sinus tenderness (mild) present.     Left Sinus: Frontal sinus tenderness (mild) present.     Mouth/Throat:     Mouth: Mucous membranes are moist.     Pharynx: Posterior oropharyngeal erythema present. No pharyngeal swelling, oropharyngeal exudate or uvula swelling.     Tonsils: No tonsillar exudate or tonsillar abscesses. 2+ on the right. 2+ on the left.  Cardiovascular:     Rate and Rhythm: Normal rate and regular rhythm.  Pulmonary:     Effort: Pulmonary effort is normal.     Breath sounds: Normal breath sounds.  Musculoskeletal:        General: Normal range of motion.  Lymphadenopathy:     Head:     Right side of head: No preauricular or posterior auricular adenopathy.     Left side of head: No preauricular or  posterior auricular adenopathy.     Cervical: No cervical adenopathy.  Skin:    General: Skin is warm and dry.  Neurological:     Mental Status: She is alert.  Psychiatric:        Mood and Affect: Mood normal.        Behavior: Behavior normal.   BP (!) 132/91 (BP Location: Left Arm, Patient Position: Sitting, Cuff Size: Normal)   Pulse 74   Temp 97.8 F (36.6 C) (Temporal)   Ht '5\' 3"'$  (1.6 m)   Wt 110 lb 2 oz (50 kg)   LMP 12/14/2022 (Exact Date)   SpO2 96%   BMI 19.51 kg/m  Wt Readings from Last 3 Encounters:  01/08/23 110 lb 2 oz (50 kg)  12/21/22 112 lb 6 oz (51 kg)  12/03/20 114 lb 6.4 oz (51.9 kg)       Jeanie Sewer, NP

## 2023-01-16 ENCOUNTER — Other Ambulatory Visit: Payer: Self-pay | Admitting: Family

## 2023-01-16 DIAGNOSIS — J4520 Mild intermittent asthma, uncomplicated: Secondary | ICD-10-CM

## 2023-01-18 NOTE — Telephone Encounter (Signed)
please call pharmacy and ask what other generic albuterol inhaler is covered. I believe I ordered this one because the others were not covered. If nothing, need to ask pt for another pharmacy to send the RX.

## 2023-01-25 ENCOUNTER — Other Ambulatory Visit: Payer: Self-pay | Admitting: Family

## 2023-01-25 DIAGNOSIS — J4520 Mild intermittent asthma, uncomplicated: Secondary | ICD-10-CM

## 2023-01-29 ENCOUNTER — Other Ambulatory Visit: Payer: Self-pay | Admitting: Family

## 2023-01-29 DIAGNOSIS — J4520 Mild intermittent asthma, uncomplicated: Secondary | ICD-10-CM

## 2023-01-30 MED ORDER — LEVALBUTEROL TARTRATE 45 MCG/ACT IN AERO: INHALATION_SPRAY | RESPIRATORY_TRACT | 2 refills | Status: DC

## 2023-01-31 ENCOUNTER — Other Ambulatory Visit: Payer: Self-pay | Admitting: Family

## 2023-01-31 DIAGNOSIS — J4520 Mild intermittent asthma, uncomplicated: Secondary | ICD-10-CM

## 2023-02-01 NOTE — Telephone Encounter (Signed)
Hey - please tell me what is going on with this RX?? I have resent this several times now, and I think to a different pharmacy this last time

## 2023-02-02 MED ORDER — ALBUTEROL SULFATE HFA 108 (90 BASE) MCG/ACT IN AERS: 2.0000 | INHALATION_SPRAY | Freq: Four times a day (QID) | RESPIRATORY_TRACT | 2 refills | Status: AC | PRN

## 2023-02-02 NOTE — Addendum Note (Signed)
Addended byJeanie Sewer on: 02/02/2023 12:59 PM   Modules accepted: Orders

## 2023-02-14 ENCOUNTER — Ambulatory Visit: Payer: BC Managed Care – PPO | Admitting: Allergy

## 2023-03-19 ENCOUNTER — Ambulatory Visit (INDEPENDENT_AMBULATORY_CARE_PROVIDER_SITE_OTHER): Payer: BC Managed Care – PPO | Admitting: Family

## 2023-03-19 ENCOUNTER — Encounter: Payer: Self-pay | Admitting: Family

## 2023-03-19 ENCOUNTER — Other Ambulatory Visit (HOSPITAL_COMMUNITY)
Admission: RE | Admit: 2023-03-19 | Discharge: 2023-03-19 | Disposition: A | Discharge status: Home / Self Care | Payer: BC Managed Care – PPO | Source: Ambulatory Visit | Attending: Family | Admitting: Family

## 2023-03-19 VITALS — BP 122/86 | HR 74 | Ht 63.0 in | Wt 116.4 lb

## 2023-03-19 DIAGNOSIS — Z113 Encounter for screening for infections with a predominantly sexual mode of transmission: Secondary | ICD-10-CM | POA: Insufficient documentation

## 2023-03-19 DIAGNOSIS — Z0000 Encounter for general adult medical examination without abnormal findings: Secondary | ICD-10-CM | POA: Insufficient documentation

## 2023-03-19 NOTE — Patient Instructions (Signed)
It was very nice to see you today!   I will review your lab results via MyChart in a few days.       PLEASE NOTE:  If you had any lab tests please let us know if you have not heard back within a few days. You may see your results on MyChart before we have a chance to review them but we will give you a call once they are reviewed by us. If we ordered any referrals today, please let us know if you have not heard from their office within the next week.    

## 2023-03-19 NOTE — Progress Notes (Signed)
Phone [phone]  Subjective:   Patient is a 22 y.o. female presenting for annual physical.    Chief Complaint  Patient presents with   Annual Exam    Not Fasting w/ labs     See problem oriented charting- ROS- full  review of systems was completed and negative.   The following were reviewed and entered/updated in epic: Past Medical History:  Diagnosis Date   Allergy    Anemia    Anxiety    Asthma    Sickle cell anemia (HCC)    Patient Active Problem List   Diagnosis Date Noted   Mild intermittent asthma without complication 12/21/2022   MDD (major depressive disorder), recurrent episode, moderate (HCC) 03/19/2019   Sleep disturbance 03/19/2019   Anorexia 03/19/2019   GAD (generalized anxiety disorder) 03/19/2019   Iron deficiency anemia due to chronic blood loss 06/26/2018   Menorrhagia with regular cycle 06/26/2018   No past surgical history on file.  Family History  Problem Relation Age of Onset   Depression Mother    Asthma Mother    Depression Sister     Medications- reviewed and updated Current Outpatient Medications  Medication Sig Dispense Refill   albuterol (VENTOLIN HFA) 108 (90 Base) MCG/ACT inhaler Inhale 2 puffs into the lungs every 6 (six) hours as needed for wheezing or shortness of breath. 8 g 2   No current facility-administered medications for this visit.    Allergies-reviewed and updated No Known Allergies  Social History   Social History Narrative   Lives with Girlfriend; Girlfriend's mother; No pets in the house.    Objective:  BP 122/86   Pulse 74   Ht 5\' 3"  (1.6 m)   Wt 116 lb 6.4 oz (52.8 kg)   LMP 03/13/2023 (Approximate)   SpO2 97%   BMI 20.62 kg/m  Physical Exam Vitals and nursing note reviewed. Exam conducted with a chaperone present.  Constitutional:      Appearance: Normal appearance.  HENT:     Head: Normocephalic.     Right Ear: Tympanic membrane normal.     Left Ear: Tympanic membrane normal.     Nose:  Nose normal.     Mouth/Throat:     Mouth: Mucous membranes are moist.  Eyes:     Pupils: Pupils are equal, round, and reactive to light.  Cardiovascular:     Rate and Rhythm: Normal rate and regular rhythm.  Pulmonary:     Effort: Pulmonary effort is normal.     Breath sounds: Normal breath sounds.  Genitourinary:    Exam position: Lithotomy position.     Pubic Area: No rash or pubic lice.      Labia:        Right: No rash.        Left: No rash.      Vagina: Normal.     Cervix: Normal.     Comments: PAP smear specimen obtained. Musculoskeletal:        General: Normal range of motion.     Cervical back: Normal range of motion.  Lymphadenopathy:     Cervical: No cervical adenopathy.  Skin:    General: Skin is warm and dry.  Neurological:     Mental Status: She is alert.  Psychiatric:        Mood and Affect: Mood normal.        Behavior: Behavior normal.      Assessment and Plan   Health Maintenance counseling: 1. Anticipatory guidance: Patient counseled  regarding regular dental exams q6 months, eye exams,  avoiding smoking and second hand smoke, limiting alcohol to 1 beverage per day, no illicit drugs.   2. Risk factor reduction:  Advised patient of need for regular exercise and diet rich with fruits and vegetables to reduce risk of heart attack and stroke. Wt Readings from Last 3 Encounters:  03/19/23 116 lb 6.4 oz (52.8 kg)  01/08/23 110 lb 2 oz (50 kg)  12/21/22 112 lb 6 oz (51 kg)   3. Immunizations/screenings/ancillary studies Immunization History  Administered Date(s) Administered   DTaP 01/17/1999, 04/20/1999, 12/19/2001, 07/22/2004   HIB (PRP-OMP) 01/17/1999, 04/20/1999, 12/19/2001   HPV Quadrivalent 03/13/2008, 06/21/2009   Hepatitis A 03/13/2008, 06/21/2009   Hepatitis B 01/17/1999, 05/12/1999, 12/19/2001   IPV 01/17/1999, 04/20/1999, 12/19/2001, 07/22/2004   Influenza,inj,Quad PF,6+ Mos 08/17/2016, 12/11/2018   MMR 12/19/2001, 07/22/2004   Meningococcal  Conjugate 08/17/2016   Tdap 03/13/2008, 06/24/2011   Varicella 12/19/2001, 03/13/2008   Health Maintenance Due  Topic Date Due   COVID-19 Vaccine (1) Never done   Hepatitis C Screening  Never done   PAP-Cervical Cytology Screening  Never done   PAP SMEAR-Modifier  Never done   DTaP/Tdap/Td (6 - Td or Tdap) 06/23/2021    4. Cervical cancer screening: due today 5. Skin cancer screening- advised regular sunscreen use. Denies worrisome, changing, or new skin lesions.  6. Birth control/STD check: condoms, not sexually active now 7. Smoking associated screening: non- smoker 8. Alcohol screening: rare  Annual physical exam -     Comprehensive metabolic panel -     CBC with Differential/Platelet -     Lipid panel -     TSH -     Cytology - PAP  Screen for STD (sexually transmitted disease) -     Cytology - PAP  Recommended follow up:  No follow-ups on file. No future appointments.  Lab/Order associations: not- fasting    Dulce Sellar, NP

## 2023-03-20 LAB — CBC WITH DIFFERENTIAL/PLATELET
Basophils Absolute: 0.1 10*3/uL (ref 0.0–0.1)
Basophils Relative: 1.2 % (ref 0.0–3.0)
Eosinophils Absolute: 0.1 10*3/uL (ref 0.0–0.7)
Eosinophils Relative: 1.4 % (ref 0.0–5.0)
HCT: 41.8 % (ref 36.0–46.0)
Hemoglobin: 13.7 g/dL (ref 12.0–15.0)
Lymphocytes Relative: 45.9 % (ref 12.0–46.0)
Lymphs Abs: 3.3 10*3/uL (ref 0.7–4.0)
MCHC: 32.7 g/dL (ref 30.0–36.0)
MCV: 86.8 fl (ref 78.0–100.0)
Monocytes Absolute: 0.6 10*3/uL (ref 0.1–1.0)
Monocytes Relative: 7.8 % (ref 3.0–12.0)
Neutro Abs: 3.1 10*3/uL (ref 1.4–7.7)
Neutrophils Relative %: 43.7 % (ref 43.0–77.0)
Platelets: 278 10*3/uL (ref 150.0–400.0)
RBC: 4.82 Mil/uL (ref 3.87–5.11)
RDW: 13 % (ref 11.5–15.5)
WBC: 7.2 10*3/uL (ref 4.0–10.5)

## 2023-03-20 LAB — COMPREHENSIVE METABOLIC PANEL
ALT: 15 U/L (ref 0–35)
AST: 20 U/L (ref 0–37)
Albumin: 4.2 g/dL (ref 3.5–5.2)
Alkaline Phosphatase: 48 U/L (ref 39–117)
BUN: 11 mg/dL (ref 6–23)
CO2: 28 mEq/L (ref 19–32)
Calcium: 9.8 mg/dL (ref 8.4–10.5)
Chloride: 99 mEq/L (ref 96–112)
Creatinine, Ser: 0.84 mg/dL (ref 0.40–1.20)
GFR: 97.32 mL/min (ref >60.00)
Glucose, Bld: 75 mg/dL (ref 70–99)
Potassium: 4 mEq/L (ref 3.5–5.1)
Sodium: 137 mEq/L (ref 135–145)
Total Bilirubin: 0.5 mg/dL (ref 0.2–1.2)
Total Protein: 7.3 g/dL (ref 6.0–8.3)

## 2023-03-20 LAB — LIPID PANEL
Cholesterol: 164 mg/dL (ref 0–200)
HDL: 80.2 mg/dL (ref >39.00)
LDL Cholesterol: 65 mg/dL (ref 0–99)
NonHDL: 84.08
Total CHOL/HDL Ratio: 2
Triglycerides: 93 mg/dL (ref 0.0–149.0)
VLDL: 18.6 mg/dL (ref 0.0–40.0)

## 2023-03-20 LAB — TSH: TSH: 2.02 u[IU]/mL (ref 0.35–5.50)

## 2023-03-21 LAB — CYTOLOGY - PAP
Adequacy: ABSENT
Chlamydia: NEGATIVE
Comment: NEGATIVE
Comment: NEGATIVE
Comment: NORMAL
Diagnosis: NEGATIVE
Neisseria Gonorrhea: NEGATIVE
Trichomonas: NEGATIVE

## 2023-07-20 ENCOUNTER — Ambulatory Visit: Payer: BC Managed Care – PPO | Admitting: Family

## 2023-07-23 ENCOUNTER — Ambulatory Visit (INDEPENDENT_AMBULATORY_CARE_PROVIDER_SITE_OTHER): Payer: BC Managed Care – PPO | Admitting: Family

## 2023-07-23 VITALS — BP 114/74 | HR 65 | Temp 98.4°F | Ht 63.0 in | Wt 121.6 lb

## 2023-07-23 DIAGNOSIS — J069 Acute upper respiratory infection, unspecified: Secondary | ICD-10-CM | POA: Diagnosis not present

## 2023-07-23 DIAGNOSIS — N920 Excessive and frequent menstruation with regular cycle: Secondary | ICD-10-CM | POA: Diagnosis not present

## 2023-07-23 DIAGNOSIS — Z23 Encounter for immunization: Secondary | ICD-10-CM

## 2023-07-23 LAB — POC COVID19 BINAXNOW: SARS Coronavirus 2 Ag: NEGATIVE

## 2023-07-23 MED ORDER — DROSPIRENONE-ETHINYL ESTRADIOL 3-0.02 MG PO TABS: 1.0000 | ORAL_TABLET | Freq: Every day | ORAL | 3 refills | Status: DC

## 2023-07-23 NOTE — Progress Notes (Signed)
Patient ID: Andrea Fernandez, female    DOB: [birth year], 22 y.o.   MRN: [id number]  Chief Complaint  Patient presents with   Headache    sx for 4d    HPI:      URI sx  - pt reports having  URI sx for 4d. Pt states that headache started today, appetite has been down, has had many other symptoms (chills, stomach pain, nausea) but are not present today, but wants to be sure she does not have covid.   Irregular menses;  heavy bleeding and cramps lasts 4 days, but is regular each month. Had been on Nexplanon but bled all the time. Also was on Depo shots but would forget to come in to get shots. Would like to try OCPs.  Assessment & Plan:  Menorrhagia with regular cycle Assessment & Plan: having heavy bleeding and severe cramping for last 4 months not currently on any hormones hx of nexplanon (SE - continued bleeding) & DEPO - less bleeding but could not remember to get shots wants to try OCP sending generic Yaz, advised on use & SE & to give full 3 months as can take that long to control sx f/u 1 yr or prn  Orders: -     Drospirenone-Ethinyl Estradiol; Take 1 tablet by mouth daily.  Dispense: 84 tablet; Refill: 3  Viral upper respiratory tract infection rapid covid neg. advised on Naproxen bid for 3-4d until no sx, OTC generic sudafed 1-2x/d, saline nasal spray tid prn. Letter provided for her work for missed days.   -     POC COVID-19 BinaxNow  Immunization due -     Tdap vaccine greater than or equal to 7yo IM   Subjective:    Outpatient Medications Prior to Visit  Medication Sig Dispense Refill   albuterol (VENTOLIN HFA) 108 (90 Base) MCG/ACT inhaler Inhale 2 puffs into the lungs every 6 (six) hours as needed for wheezing or shortness of breath. (Patient not taking: Reported on 07/23/2023) 8 g 2   No facility-administered medications prior to visit.   Past Medical History:  Diagnosis Date   Allergy    Anemia    Anorexia 03/19/2019   Anxiety    Asthma    GAD (generalized  anxiety disorder) 03/19/2019   MDD (major depressive disorder), recurrent episode, moderate (HCC) 03/19/2019   Sickle cell anemia (HCC)    Sleep disturbance 03/19/2019   No past surgical history on file. No Known Allergies    Objective:    Physical Exam Vitals and nursing note reviewed.  Constitutional:      Appearance: Normal appearance.  Cardiovascular:     Rate and Rhythm: Normal rate and regular rhythm.  Pulmonary:     Effort: Pulmonary effort is normal.     Breath sounds: Normal breath sounds.  Musculoskeletal:        General: Normal range of motion.  Skin:    General: Skin is warm and dry.  Neurological:     Mental Status: She is alert.  Psychiatric:        Mood and Affect: Mood normal.        Behavior: Behavior normal.    BP 114/74   Pulse 65   Temp 98.4 F (36.9 C) (Temporal)   Ht 5\' 3"  (1.6 m)   Wt 121 lb 9.6 oz (55.2 kg)   SpO2 99%   BMI 21.54 kg/m  Wt Readings from Last 3 Encounters:  07/23/23 121 lb 9.6 oz (55.2 kg)  03/19/23 116 lb 6.4 oz (52.8 kg)  01/08/23 110 lb 2 oz (50 kg)       Dulce Sellar, NP

## 2023-07-23 NOTE — Assessment & Plan Note (Signed)
having heavy bleeding and severe cramping for last 4 months not currently on any hormones hx of nexplanon (SE - continued bleeding) & DEPO - less bleeding but could not remember to get shots wants to try OCP sending generic Yaz, advised on use & SE & to give full 3 months as can take that long to control sx f/u 1 yr or prn

## 2023-10-01 ENCOUNTER — Ambulatory Visit (INDEPENDENT_AMBULATORY_CARE_PROVIDER_SITE_OTHER): Payer: BC Managed Care – PPO | Admitting: Family

## 2023-10-01 ENCOUNTER — Encounter: Payer: Self-pay | Admitting: Family

## 2023-10-01 DIAGNOSIS — N920 Excessive and frequent menstruation with regular cycle: Secondary | ICD-10-CM | POA: Diagnosis not present

## 2023-10-01 MED ORDER — DROSPIRENONE-ETHINYL ESTRADIOL 3-0.02 MG PO TABS: 1.0000 | ORAL_TABLET | Freq: Every day | ORAL | 3 refills | Status: AC

## 2023-10-01 NOTE — Progress Notes (Signed)
Patient ID: Andrea Fernandez, female    DOB: 02-22-2001, 22 y.o.   MRN: [id number]  Chief Complaint  Patient presents with   Headache    Intermittent headaches since starting birth control 3 months    Discussed the use of AI scribe software for clinical note transcription with the patient, who gave verbal consent to proceed.  History of Present Illness   The patient, who recently started birth control about 2.5 months ago, presents with headaches occurring every other week for three days at a time. The headaches are described as just pressure/pain in her forehead, and are not migraine level, but last 3 days. She states she is still able to work. The patient has been using Tylenol or Ibuprofen for relief, which eases the pain but does not eliminate it. The headaches seem to correlate with the patient starting the new OCP which has regulated her menstrual cycles. She denies any new stressors or allergies. The patient reports no mood changes or. other SE since starting the OCP.     Assessment & Plan:     Headaches - Recurrent headaches every other week lasting for three days since starting Yaz birth control. Headaches are tolerable and do not interfere with daily activities. Responds partially to Tylenol. -Consider trying OTC generic Excedrin Migraine for more effective relief. -Continue Yaz birth control, skipping placebo week and starting new pack immediately to maintain consistent hormone levels and potentially reduce headache frequency. -Monitor for changes in headache frequency and intensity. If no improvement in two months, consider changing birth control method. -F/U in 2 months if no improvement in HA.  Menstrual Cycle - Regular cycles since starting Yaz birth control. No heavy bleeding or painful cramping reported. Unsure if current headaches are r/t the medication, advising to skip placebos & start new pack each month to see if headaches improve. Pt aware that she may not have any monthly  cycle by doing this. -Continue Yaz birth control as directed, skipping placebo week to potentially eliminate menstrual cycle and associated headaches. -F/U with office via MyChart if any untoward effects from above plan are noted.     Subjective:    Outpatient Medications Prior to Visit  Medication Sig Dispense Refill   albuterol (VENTOLIN HFA) 108 (90 Base) MCG/ACT inhaler Inhale 2 puffs into the lungs every 6 (six) hours as needed for wheezing or shortness of breath. 8 g 2   drospirenone-ethinyl estradiol (YAZ) 3-0.02 MG tablet Take 1 tablet by mouth daily. 84 tablet 3   No facility-administered medications prior to visit.   Past Medical History:  Diagnosis Date   Allergy    Anemia    Anorexia 03/19/2019   Anxiety    Asthma    GAD (generalized anxiety disorder) 03/19/2019   MDD (major depressive disorder), recurrent episode, moderate (HCC) 03/19/2019   Sickle cell anemia (HCC)    Sleep disturbance 03/19/2019   History reviewed. No pertinent surgical history. No Known Allergies    Objective:    Physical Exam Vitals and nursing note reviewed.  Constitutional:      Appearance: Normal appearance. She is not ill-appearing.     Interventions: Face mask in place.  HENT:     Right Ear: Tympanic membrane and ear canal normal.     Left Ear: Tympanic membrane and ear canal normal.     Nose:     Right Sinus: No frontal sinus tenderness.     Left Sinus: No frontal sinus tenderness.     Mouth/Throat:  Mouth: Mucous membranes are moist.     Pharynx: No pharyngeal swelling, oropharyngeal exudate, posterior oropharyngeal erythema or uvula swelling.     Tonsils: No tonsillar exudate or tonsillar abscesses.  Cardiovascular:     Rate and Rhythm: Normal rate and regular rhythm.  Pulmonary:     Effort: Pulmonary effort is normal.     Breath sounds: Normal breath sounds.  Musculoskeletal:        General: Normal range of motion.  Lymphadenopathy:     Head:     Right side of head:  No preauricular or posterior auricular adenopathy.     Left side of head: No preauricular or posterior auricular adenopathy.     Cervical: No cervical adenopathy.  Skin:    General: Skin is warm and dry.  Neurological:     Mental Status: She is alert.  Psychiatric:        Mood and Affect: Mood normal.        Behavior: Behavior normal.    BP 108/70   Pulse (!) 54   Temp 98.6 F (37 C) (Temporal)   Ht 5\' 3"  (1.6 m)   Wt 123 lb 12.8 oz (56.2 kg)   LMP 09/24/2023   SpO2 100%   BMI 21.93 kg/m  Wt Readings from Last 3 Encounters:  10/01/23 123 lb 12.8 oz (56.2 kg)  07/23/23 121 lb 9.6 oz (55.2 kg)  03/19/23 116 lb 6.4 oz (52.8 kg)      Dulce Sellar, NP

## 2023-12-11 ENCOUNTER — Emergency Department (HOSPITAL_BASED_OUTPATIENT_CLINIC_OR_DEPARTMENT_OTHER)
Admission: EM | Admit: 2023-12-11 | Discharge: 2023-12-11 | Disposition: A | Discharge status: Home / Self Care | Payer: BC Managed Care – PPO | Attending: Emergency Medicine | Admitting: Emergency Medicine

## 2023-12-11 ENCOUNTER — Other Ambulatory Visit: Payer: Self-pay

## 2023-12-11 ENCOUNTER — Encounter (HOSPITAL_BASED_OUTPATIENT_CLINIC_OR_DEPARTMENT_OTHER): Payer: Self-pay | Admitting: Emergency Medicine

## 2023-12-11 DIAGNOSIS — Z20822 Contact with and (suspected) exposure to covid-19: Secondary | ICD-10-CM | POA: Insufficient documentation

## 2023-12-11 DIAGNOSIS — J45909 Unspecified asthma, uncomplicated: Secondary | ICD-10-CM | POA: Diagnosis not present

## 2023-12-11 DIAGNOSIS — J09X2 Influenza due to identified novel influenza A virus with other respiratory manifestations: Secondary | ICD-10-CM | POA: Insufficient documentation

## 2023-12-11 DIAGNOSIS — J101 Influenza due to other identified influenza virus with other respiratory manifestations: Secondary | ICD-10-CM

## 2023-12-11 LAB — GROUP A STREP BY PCR: Group A Strep by PCR: NOT DETECTED

## 2023-12-11 LAB — RESP PANEL BY RT-PCR (RSV, FLU A&B, COVID)  RVPGX2
Influenza A by PCR: POSITIVE — AB
Influenza B by PCR: NEGATIVE
Resp Syncytial Virus by PCR: NEGATIVE
SARS Coronavirus 2 by RT PCR: NEGATIVE

## 2023-12-11 NOTE — Discharge Instructions (Signed)
You were seen in the emergency department today for flulike symptoms.  You tested positive for influenza A.  I would recommend managing the symptoms with over-the-counter occasions at home as needed such as Tylenol and ibuprofen.  Return to the emergency department for chest pain or shortness of breath.  You should expect to feel somewhat unwell for the next 7 to 14 days.  If you were having worsening symptoms in this period of time, please follow-up with your primary care provider.

## 2023-12-11 NOTE — ED Provider Notes (Signed)
[place] EMERGENCY DEPARTMENT AT [facility] Provider Note   CSN: [id number] Arrival date & time: 12/11/23  1054     History Chief Complaint  Patient presents with   flu like symptoms    Andrea Fernandez is a 23 y.o. female.  Patient past history significant for iron deficiency anemia and asthma presents the emergency department concerns of flulike symptoms.  She endorses cough, sore throat, ear pain since Saturday.  States that she was at a wedding and began to feel sick at that time.  Multiple individuals at this wedding sick with similar symptoms.  Denies any significant GI concerns.  HPI     Home Medications Prior to Admission medications   Medication Sig Start Date End Date Taking? Authorizing Provider  albuterol (VENTOLIN HFA) 108 (90 Base) MCG/ACT inhaler Inhale 2 puffs into the lungs every 6 (six) hours as needed for wheezing or shortness of breath. 02/02/23   Dulce Sellar, NP  drospirenone-ethinyl estradiol (YAZ) 3-0.02 MG tablet Take 1 tablet by mouth daily. Skip the last 4 pills in pack (different color) and start a new pack each month. 10/01/23   Dulce Sellar, NP      Allergies    Patient has no known allergies.    Review of Systems   Review of Systems  Respiratory:  Positive for cough.   All other systems reviewed and are negative.   Physical Exam Updated Vital Signs BP (!) 155/92 (BP Location: Right Arm)   Pulse 84   Temp 98.9 F (37.2 C) (Oral)   Resp 16   Ht 5\' 3"  (1.6 m)   Wt 59 kg   LMP 11/07/2023   SpO2 99%   BMI 23.03 kg/m  Physical Exam Vitals and nursing note reviewed.  Constitutional:      General: She is not in acute distress.    Appearance: She is well-developed.  HENT:     Head: Normocephalic and atraumatic.  Eyes:     Conjunctiva/sclera: Conjunctivae normal.  Cardiovascular:     Rate and Rhythm: Normal rate and regular rhythm.     Heart sounds: No murmur heard. Pulmonary:     Effort: Pulmonary effort is  normal. No respiratory distress.     Breath sounds: Normal breath sounds.  Abdominal:     Palpations: Abdomen is soft.     Tenderness: There is no abdominal tenderness.  Musculoskeletal:        General: No swelling.     Cervical back: Neck supple.  Skin:    General: Skin is warm and dry.     Capillary Refill: Capillary refill takes less than 2 seconds.  Neurological:     Mental Status: She is alert.  Psychiatric:        Mood and Affect: Mood normal.     ED Results / Procedures / Treatments   Labs (all labs ordered are listed, but only abnormal results are displayed) Labs Reviewed  RESP PANEL BY RT-PCR (RSV, FLU A&B, COVID)  [id number] - Abnormal; Notable for the following components:      Result Value   Influenza A by PCR POSITIVE (*)    All other components within normal limits  GROUP A STREP BY PCR    EKG None  Radiology No results found.  Procedures Procedures    Medications Ordered in ED Medications - No data to display  ED Course/ Medical Decision Making/ A&P  Medical Decision Making  This patient presents to the ED for concern of flulike symptoms.  Differential diagnosis includes influenza A, influenza B, COVID-19, pneumonia   Lab Tests:  I Ordered, and personally interpreted labs.  The pertinent results include: Respiratory panel positive for influenza A   Problem List / ED Course:  Patient with past history significant for asthma and iron deficiency anemia presents to the emergency department concerns of flulike symptoms.  She reports that this been ongoing for about 4 days after a suspected exposure at a wedding party.  States that multiple dividual's been sick with similar symptoms.  Denies any GI concerns but does feel that she is somewhat more gassy.  No recent vomiting or diarrhea. On exam there is no significant wheezing, or abnormal lung sounds.  No abdominal tenderness either.  Respiratory panel collected and  pending. Respiratory panel is positive for influenza A.  I suspect this is patient's course of symptoms.  Given reassuring physical exam, I feel that she can follow-up outpatient with her PCP.  Discussed return precautions such as worsening chest pain or shortness of breath.  Patient discharged home in stable condition.  Final Clinical Impression(s) / ED Diagnoses Final diagnoses:  Influenza A    Rx / DC Orders ED Discharge Orders     None         Smitty Knudsen, PA-C 12/11/23 1324    Melene Plan, DO 12/11/23 1448

## 2023-12-11 NOTE — ED Triage Notes (Signed)
Cough, sore throat, ear pain since Saturday.

## 2023-12-31 ENCOUNTER — Ambulatory Visit: Payer: BC Managed Care – PPO | Admitting: Family

## 2024-07-11 ENCOUNTER — Ambulatory Visit: Admitting: Family
